# Patient Record
Sex: Female | Born: 1975 | Race: White | Hispanic: Yes | Marital: Married | Smoking: Never smoker
Health system: Southern US, Community
[De-identification: ages and names within clinical notes are randomized; demographics above are authoritative.]

---

## 2004-07-09 ENCOUNTER — Ambulatory Visit (HOSPITAL_COMMUNITY): Admission: RE | Admit: 2004-07-09 | Discharge: 2004-07-09 | Payer: Self-pay | Admitting: Obstetrics and Gynecology

## 2004-09-30 ENCOUNTER — Ambulatory Visit (HOSPITAL_COMMUNITY): Admission: RE | Admit: 2004-09-30 | Discharge: 2004-09-30 | Payer: Self-pay | Admitting: *Deleted

## 2004-10-24 ENCOUNTER — Inpatient Hospital Stay (HOSPITAL_COMMUNITY): Admission: AD | Admit: 2004-10-24 | Discharge: 2004-10-27 | Payer: Self-pay | Admitting: Obstetrics and Gynecology

## 2004-10-24 ENCOUNTER — Ambulatory Visit: Payer: Self-pay | Admitting: Obstetrics & Gynecology

## 2007-07-21 ENCOUNTER — Emergency Department (HOSPITAL_COMMUNITY): Admission: EM | Admit: 2007-07-21 | Discharge: 2007-07-21 | Payer: Self-pay | Admitting: Emergency Medicine

## 2009-07-26 ENCOUNTER — Emergency Department (HOSPITAL_COMMUNITY): Admission: EM | Admit: 2009-07-26 | Discharge: 2009-07-26 | Payer: Self-pay | Admitting: Emergency Medicine

## 2010-07-10 LAB — DIFFERENTIAL
Basophils Relative: 1 % (ref 0–1)
Eosinophils Absolute: 0.2 10*3/uL (ref 0.0–0.7)
Monocytes Absolute: 0.6 10*3/uL (ref 0.1–1.0)
Neutro Abs: 10 10*3/uL — ABNORMAL HIGH (ref 1.7–7.7)

## 2010-07-10 LAB — URINALYSIS, ROUTINE W REFLEX MICROSCOPIC
Bilirubin Urine: NEGATIVE
Glucose, UA: NEGATIVE mg/dL
Hgb urine dipstick: NEGATIVE
Ketones, ur: NEGATIVE mg/dL
Nitrite: NEGATIVE
Urobilinogen, UA: 0.2 mg/dL (ref 0.0–1.0)

## 2010-07-10 LAB — CBC
HCT: 39.8 % (ref 36.0–46.0)
Hemoglobin: 13.6 g/dL (ref 12.0–15.0)
MCHC: 34.3 g/dL (ref 30.0–36.0)
RDW: 12.8 % (ref 11.5–15.5)

## 2010-07-10 LAB — LIPASE, BLOOD: Lipase: 17 U/L (ref 11–59)

## 2010-07-10 LAB — COMPREHENSIVE METABOLIC PANEL
ALT: 28 U/L (ref 0–35)
AST: 19 U/L (ref 0–37)
Calcium: 9 mg/dL (ref 8.4–10.5)
GFR calc non Af Amer: >60 mL/min (ref >60)
Glucose, Bld: 115 mg/dL — ABNORMAL HIGH (ref 70–99)

## 2013-03-04 ENCOUNTER — Ambulatory Visit: Payer: Self-pay

## 2013-03-08 ENCOUNTER — Ambulatory Visit: Payer: Self-pay

## 2014-11-04 ENCOUNTER — Encounter (HOSPITAL_COMMUNITY): Payer: Self-pay | Admitting: Emergency Medicine

## 2014-11-04 ENCOUNTER — Emergency Department (HOSPITAL_COMMUNITY): Payer: Self-pay

## 2014-11-04 ENCOUNTER — Emergency Department (HOSPITAL_COMMUNITY)
Admission: EM | Admit: 2014-11-04 | Discharge: 2014-11-04 | Disposition: A | Discharge status: Home / Self Care | Payer: Self-pay | Attending: Emergency Medicine | Admitting: Emergency Medicine

## 2014-11-04 DIAGNOSIS — R109 Unspecified abdominal pain: Secondary | ICD-10-CM

## 2014-11-04 DIAGNOSIS — K2970 Gastritis, unspecified, without bleeding: Secondary | ICD-10-CM | POA: Insufficient documentation

## 2014-11-04 LAB — CBC
HCT: 37.5 % (ref 36.0–46.0)
HEMOGLOBIN: 13.2 g/dL (ref 12.0–15.0)
MCH: 32.8 pg (ref 26.0–34.0)
MCHC: 35.2 g/dL (ref 30.0–36.0)
MCV: 93.1 fL (ref 78.0–100.0)
Platelets: 297 10*3/uL (ref 150–400)
RBC: 4.03 MIL/uL (ref 3.87–5.11)
RDW: 12.6 % (ref 11.5–15.5)
WBC: 10 10*3/uL (ref 4.0–10.5)

## 2014-11-04 LAB — COMPREHENSIVE METABOLIC PANEL
ALT: 28 U/L (ref 14–54)
AST: 20 U/L (ref 15–41)
Albumin: 3.8 g/dL (ref 3.5–5.0)
Alkaline Phosphatase: 76 U/L (ref 38–126)
Anion gap: 8 (ref 5–15)
BUN: 8 mg/dL (ref 6–20)
CALCIUM: 9.3 mg/dL (ref 8.9–10.3)
CHLORIDE: 101 mmol/L (ref 101–111)
CO2: 26 mmol/L (ref 22–32)
Creatinine, Ser: 0.49 mg/dL (ref 0.44–1.00)
GFR calc Af Amer: >60 mL/min (ref >60)
Glucose, Bld: 120 mg/dL — ABNORMAL HIGH (ref 65–99)
POTASSIUM: 3.6 mmol/L (ref 3.5–5.1)
SODIUM: 135 mmol/L (ref 135–145)
Total Bilirubin: 0.3 mg/dL (ref 0.3–1.2)
Total Protein: 7.3 g/dL (ref 6.5–8.1)

## 2014-11-04 LAB — URINALYSIS, ROUTINE W REFLEX MICROSCOPIC
Bilirubin Urine: NEGATIVE
GLUCOSE, UA: NEGATIVE mg/dL
HGB URINE DIPSTICK: NEGATIVE
Ketones, ur: NEGATIVE mg/dL
Leukocytes, UA: NEGATIVE
Nitrite: NEGATIVE
PROTEIN: 30 mg/dL — AB
Specific Gravity, Urine: 1.009 (ref 1.005–1.030)
Urobilinogen, UA: 0.2 mg/dL (ref 0.0–1.0)
pH: 7.5 (ref 5.0–8.0)

## 2014-11-04 LAB — I-STAT BETA HCG BLOOD, ED (MC, WL, AP ONLY)

## 2014-11-04 LAB — URINE MICROSCOPIC-ADD ON

## 2014-11-04 LAB — LIPASE, BLOOD: LIPASE: 16 U/L — AB (ref 22–51)

## 2014-11-04 MED ORDER — MORPHINE SULFATE 4 MG/ML IJ SOLN
4.0000 mg | Freq: Once | INTRAMUSCULAR | Status: AC
  Administered 2014-11-04: 4 mg via INTRAVENOUS
  Filled 2014-11-04: qty 1

## 2014-11-04 MED ORDER — SODIUM CHLORIDE 0.9 % IV BOLUS (SEPSIS)
1000.0000 mL | Freq: Once | INTRAVENOUS | Status: AC
  Administered 2014-11-04: 1000 mL via INTRAVENOUS

## 2014-11-04 MED ORDER — PANTOPRAZOLE SODIUM 20 MG PO TBEC: 20.0000 mg | DELAYED_RELEASE_TABLET | Freq: Every day | ORAL | Status: DC

## 2014-11-04 MED ORDER — ONDANSETRON HCL 4 MG/2ML IJ SOLN
4.0000 mg | Freq: Once | INTRAMUSCULAR | Status: AC
  Administered 2014-11-04: 4 mg via INTRAVENOUS
  Filled 2014-11-04: qty 2

## 2014-11-04 MED ORDER — PANTOPRAZOLE SODIUM 40 MG IV SOLR
40.0000 mg | INTRAVENOUS | Status: AC
  Administered 2014-11-04: 40 mg via INTRAVENOUS
  Filled 2014-11-04: qty 40

## 2014-11-04 MED ORDER — SUCRALFATE 1 GM/10ML PO SUSP: 1.0000 g | Freq: Three times a day (TID) | ORAL | Status: DC

## 2014-11-04 NOTE — ED Notes (Signed)
Patient transported to Ultrasound 

## 2014-11-04 NOTE — ED Notes (Signed)
Pt. reports mid abdominal pain with emesis onset today , denies diarrhea/no fever or chills.

## 2014-11-04 NOTE — Discharge Instructions (Signed)
Tome Protonix y American Family InsuranceCarafate como se prescribe para tratar sus sntomas . Haga un seguimiento con un mdico de atencin primaria . Volver al servicio de urgencias si los sntomas empeoran a pesar del tratamiento   Take Protonix and Carafate as prescribed for your symptoms. Follow up with a primary care doctor. Return to the ED if symptoms worsen despite the treatment recommended.  Gastritis - Adultos  (Gastritis, Adult)  La gastrittis es la irritacin (inflamacin) de la membrana interna del estmago. Puede ser Neomia Dearuna enfermedad de inicio sbito (aguda) o de largo plazo (crnica). Si la gastritis no se trata, puede causar sangrado y lceras. CAUSAS  La gastritis se produce cuando la membrana que tapiza interiormente al estmago se debilita o se daa. Los jugos digestivos del estmago inflaman el revestimiento del estmago debilitado. El revestimiento del estmago puede debilitarse o daarse por una infeccin viral o bacteriana. La infeccin bacteriana ms comn es la infeccin por Helicobacter pylori. Tambin puede ser el resultado del consumo excesivo de alcohol, por el uso de ciertos medicamentos o porque hay demasiado cido en el estmago.  SNTOMAS  En algunos casos no hay sntomas. Si se presentan sntomas, stos pueden ser:   Dolor o sensacin de ardor en la parte superior del abdomen.  Nuseas.  Vmitos.  Sensacin molesta de distensin despus de comer. DIAGNSTICO  El mdico puede diagnosticar gastritis segn los sntomas y el examen fsico. Para determinar la causa de la gastritis, el mdico podr:   Pedir anlisis de sangre o de materia fecal para diagnosticar la presencia de la bacteria H pylori.  Gastroscopa. Un tubo delgado y flexible (endoscopio) se pasa por Theatre stage managerel esfago hasta llegar al Teachers Insurance and Annuity Associationestmago. El endoscopio tiene [place] luz y una cmara en el extremo. El mdico utilizar el endoscopio para observar el interior del [place]mago.  Tomar una muestra de tejido (biopsia) del estmago para  examinarlo en el microscopio. TRATAMIENTO  Segn la causa de la gastritis podrn recetarle: Antibiticos, si la causa es una infeccin bacteriana, como una infeccin por H. pylori. Anticidos o bloqueadores H2, si hay demasiado cido en el estmago. El Office Depotmdico le aconsejar que deje de tomar aspirina, ibuprofeno u otros antiinflamatorios no esteroides (AINE).  INSTRUCCIONES PARA EL CUIDADO EN EL HOGAR   Tome slo medicamentos de venta libre o recetados, segn las indicaciones del mdico.  Si le han recetado antibiticos, tmelos segn las indicaciones. Tmelos todos, aunque se sienta mejor.  Debe ingerir gran cantidad de lquido para mantener la orina de tono claro o color amarillo plido.  Evite las comidas y bebidas que [address] Avenueempeoran los Lorraineproblemas, [place]:  MinnesotaBebidas con cafena o alcohlicas.  Chocolate.  Sabores a Advertising account plannermenta.  Ajo y cebolla.  Comidas muy condimentadas.  Ctricos como naranjas, limones o limas.  Alimentos que contengan tomate, como salsas, [place] y pizza.  Alimentos fritos y Lexicographergrasos.  Haga comidas pequeas durante Glass blower/designerel da en lugar de 3 comidas abundantes. SOLICITE ATENCIN MDICA DE INMEDIATO SI:   La materia fecal es negra o de color rojo oscuro.  Vomita sangre de color rojo brillante o material similar a granos de caf.  No puede retener los lquidos.  El dolor abdominal empeora.  Tiene fiebre.  No mejora luego de 1 semana.  Tiene preguntas o preocupaciones. ASEGRESE DE QUE:   Comprende estas instrucciones.  Controlar su enfermedad.  Solicitar ayuda de inmediato si no mejora o si empeora. Document Released: 01/15/2005 Document Revised: 12/31/2011  [facility] Patient Information 2015 [place], [place]. This information is not intended to replace advice  given to you by your health care provider. Make sure you discuss any questions you have with your health care provider.

## 2014-11-04 NOTE — ED Notes (Signed)
Pt still at [facility]

## 2014-11-04 NOTE — ED Provider Notes (Signed)
CSN: [id number]     Arrival date & time 11/04/14  1912 History   First MD Initiated Contact with Patient 11/04/14 2022     Chief Complaint  Patient presents with  . Abdominal Pain  . Emesis    (Consider location/radiation/quality/duration/timing/severity/associated sxs/prior Treatment) HPI Comments: 39 year old female with no significant past medical history presents to the emergency department for further evaluation of abdominal pain. Patient reports that pain began at 1300 today, approximately one hour after eating. Patient reports that pain has been constant and present in her epigastric region without radiation. She denies taking any medications for symptoms prior to arrival. She has had associated nausea with 4 episodes of emesis since her pain began. Patient denies any history of similar symptoms. She has had no abdominal surgeries in the past. No reported fever, chest pain, shortness of breath, diarrhea, hematuria or dysuria. Pain is currently rated at 5/10.  Patient is a 39 y.o. female presenting with abdominal pain and vomiting. The history is provided by the patient. A language interpreter was used Chief Technology Officer(Language line used).  Abdominal Pain Associated symptoms: nausea and vomiting   Emesis Associated symptoms: abdominal pain     History reviewed. No pertinent past medical history. History reviewed. No pertinent past surgical history. No family history on file. History  Substance Use Topics  . Smoking status: Never Smoker   . Smokeless tobacco: Not on file  . Alcohol Use: No   OB History    No data available      Review of Systems  Gastrointestinal: Positive for nausea, vomiting and abdominal pain.  All other systems reviewed and are negative.   Allergies  Review of patient's allergies indicates no known allergies.  Home Medications   Prior to Admission medications   Medication Sig Start Date End Date Taking? Authorizing Provider  pantoprazole (PROTONIX) 20 MG tablet  Take 1 tablet (20 mg total) by mouth daily. 11/04/14   Antony MaduraKelly Zyeir Dymek, PA-C  sucralfate (CARAFATE) 1 GM/10ML suspension Take 10 mLs (1 g total) by mouth 4 (four) times daily -  with meals and at bedtime. 11/04/14   Antony MaduraKelly Lyn Joens, PA-C   BP 120/75 mmHg  Pulse 57  Temp(Src) 98.2 F (36.8 C) (Oral)  Resp 16  Ht 4\' 10"  (1.473 m)  Wt 185 lb (83.915 kg)  BMI 38.68 kg/m2  SpO2 98%  LMP 10/24/2014   Physical Exam  Constitutional: She is oriented to person, place, and time. She appears well-developed and well-nourished. No distress.  Nontoxic/nonseptic appearing  HENT:  Head: Normocephalic and atraumatic.  Eyes: Conjunctivae and EOM are normal. No scleral icterus.  Neck: Normal range of motion.  Cardiovascular: Normal rate, regular rhythm and intact distal pulses.   Pulmonary/Chest: Effort normal and breath sounds normal. No respiratory distress. She has no wheezes. She has no rales.  Respirations even and unlabored. Lungs CTAB.  Abdominal: Soft. She exhibits no distension. There is tenderness. There is no rebound and no guarding.  Tenderness to palpation in the epigastric abdomen. No masses or peritoneal signs appreciated. Abdomen is soft. Normoactive bowel sounds heard in all quadrants. Negative Murphy's sign.  Musculoskeletal: Normal range of motion.  Neurological: She is alert and oriented to person, place, and time. She exhibits normal muscle tone. Coordination normal.  GCS 15. Patient moving all extremities.  Skin: Skin is warm and dry. No rash noted. She is not diaphoretic. No erythema. No pallor.  Psychiatric: She has a normal mood and affect. Her behavior is normal.  Nursing note and  vitals reviewed.   ED Course  Procedures (including critical care time) Labs Review Labs Reviewed  LIPASE, BLOOD - Abnormal; Notable for the following:    Lipase 16 (*)    All other components within normal limits  COMPREHENSIVE METABOLIC PANEL - Abnormal; Notable for the following:    Glucose, Bld 120  (*)    All other components within normal limits  URINALYSIS, ROUTINE W REFLEX MICROSCOPIC (NOT AT [facility]) - Abnormal; Notable for the following:    Protein, ur 30 (*)    All other components within normal limits  URINE MICROSCOPIC-ADD ON - Abnormal; Notable for the following:    Squamous Epithelial / LPF FEW (*)    All other components within normal limits  CBC  I-STAT BETA HCG BLOOD, ED ([facility], [facility], [facility] ONLY)    Imaging Review US Abdomen Complete  11/04/2014   CLINICAL DATA:  Epigastric pain and vomiting for 1 day.  EXAM: ULTRASOUND ABDOMEN COMPLETE  COMPARISON:  None.  FINDINGS: Gallbladder: No gallstones or wall thickening visualized. No sonographic Murphy sign noted.  Common bile duct: Diameter: 4.4 mm  Liver: Diffusely echogenic without intrahepatic biliary dilatation or definite mass. Hepatopetal portal vein.  IVC: No abnormality visualized.  Pancreas: Visualized portion unremarkable.  Spleen: Size and appearance within normal limits.  Right Kidney: Length: 12.2 cm. Echogenicity within normal limits. No mass or hydronephrosis visualized.  Left Kidney: Length: 13.2 cm. Echogenicity within normal limits. No mass or hydronephrosis visualized.  Abdominal aorta: No aneurysm visualized.  Other findings: None.  IMPRESSION: Hepatic steatosis without sonographic findings of acute abdominal process.   Electronically Signed   By: Awilda Metro M.D.   On: 11/04/2014 22:26     EKG Interpretation None      MDM   Final diagnoses:  Abdominal pain  Gastritis    39 year old female presents to the emergency department for further evaluation of epigastric pain. Symptoms began one hour after eating and were associated with 4 episodes of emesis. Patient denies any chest pain or shortness of breath with her symptoms. Patient had complete relief of her pain with Protonix, morphine, Zofran, and fluids. Laboratory workup is noncontributory. Urinalysis and urine pregnancy negative. Abdominal ultrasound ordered  for further evaluation of symptoms, specifically to evaluate the gallbladder. Ultrasound shows evidence of hepatic steatosis without evidence of acute process.  Given reassuring workup today with stable abdominal reexamination, suspect symptoms may be secondary to gastritis. Patient does report a history of esophageal reflux. Doubt atypically presenting cardiac etiology. No indication for further emergent workup at this time. Will manage symptomatically as outpatient with Protonix and Carafate. Patient advised to follow-up with a primary care doctor regarding her symptoms today. Return precautions discussed and provided. Patient agreeable to plan with known addressed concerns. Patient discharged in good condition; VSS.   Filed Vitals:   11/04/14 2030 11/04/14 2115 11/04/14 2223 11/04/14 2300  BP: 136/83 126/81 135/75 120/75  Pulse: 71 64 63 57  Temp:      TempSrc:      Resp: Height:      Weight:      SpO2: 99% 99% 99% 98%     Antony Madura, PA-C 11/04/14 2348  Samuel Jester, DO 11/07/14 2209

## 2014-11-04 NOTE — ED Notes (Signed)
Pt. Left with all belongings and refused wheelchair 

## 2014-11-04 NOTE — ED Notes (Signed)
Pt returned from [facility]

## 2014-11-05 ENCOUNTER — Telehealth: Payer: Self-pay | Admitting: *Deleted

## 2014-11-05 NOTE — Telephone Encounter (Signed)
Pharmacist would like to substitute tabs for suspension as pt is able to afford one over the other. He will fill comparable amount.

## 2017-10-02 ENCOUNTER — Encounter (HOSPITAL_COMMUNITY): Payer: Self-pay

## 2017-10-02 ENCOUNTER — Emergency Department (HOSPITAL_COMMUNITY)
Admission: EM | Admit: 2017-10-02 | Discharge: 2017-10-02 | Disposition: A | Discharge status: Home / Self Care | Payer: Self-pay | Attending: Emergency Medicine | Admitting: Emergency Medicine

## 2017-10-02 ENCOUNTER — Other Ambulatory Visit: Payer: Self-pay

## 2017-10-02 DIAGNOSIS — Z79899 Other long term (current) drug therapy: Secondary | ICD-10-CM | POA: Insufficient documentation

## 2017-10-02 DIAGNOSIS — R1013 Epigastric pain: Secondary | ICD-10-CM | POA: Insufficient documentation

## 2017-10-02 LAB — URINALYSIS, ROUTINE W REFLEX MICROSCOPIC
Bilirubin Urine: NEGATIVE
GLUCOSE, UA: NEGATIVE mg/dL
Hgb urine dipstick: NEGATIVE
KETONES UR: NEGATIVE mg/dL
LEUKOCYTES UA: NEGATIVE
Nitrite: NEGATIVE
Protein, ur: NEGATIVE mg/dL
Specific Gravity, Urine: 1.006 (ref 1.005–1.030)
pH: 8 (ref 5.0–8.0)

## 2017-10-02 LAB — CBC
HCT: 39.8 % (ref 36.0–46.0)
Hemoglobin: 13.3 g/dL (ref 12.0–15.0)
MCH: 30.7 pg (ref 26.0–34.0)
MCHC: 33.4 g/dL (ref 30.0–36.0)
MCV: 91.9 fL (ref 78.0–100.0)
Platelets: 255 10*3/uL (ref 150–400)
RBC: 4.33 MIL/uL (ref 3.87–5.11)
RDW: 13 % (ref 11.5–15.5)
WBC: 7.1 10*3/uL (ref 4.0–10.5)

## 2017-10-02 LAB — COMPREHENSIVE METABOLIC PANEL
ALK PHOS: 91 U/L (ref 38–126)
ALT: 36 U/L (ref 14–54)
AST: 27 U/L (ref 15–41)
Albumin: 4.1 g/dL (ref 3.5–5.0)
Anion gap: 7 (ref 5–15)
BILIRUBIN TOTAL: 0.7 mg/dL (ref 0.3–1.2)
BUN: 9 mg/dL (ref 6–20)
CHLORIDE: 103 mmol/L (ref 101–111)
CO2: 27 mmol/L (ref 22–32)
CREATININE: 0.56 mg/dL (ref 0.44–1.00)
Calcium: 10.4 mg/dL — ABNORMAL HIGH (ref 8.9–10.3)
GFR calc Af Amer: >60 mL/min (ref >60)
Glucose, Bld: 127 mg/dL — ABNORMAL HIGH (ref 65–99)
Potassium: 3.4 mmol/L — ABNORMAL LOW (ref 3.5–5.1)
Sodium: 137 mmol/L (ref 135–145)
TOTAL PROTEIN: 7.9 g/dL (ref 6.5–8.1)

## 2017-10-02 LAB — I-STAT BETA HCG BLOOD, ED (MC, WL, AP ONLY): I-stat hCG, quantitative: <5 m[IU]/mL (ref <5)

## 2017-10-02 LAB — LIPASE, BLOOD: Lipase: 25 U/L (ref 11–51)

## 2017-10-02 MED ORDER — FAMOTIDINE 20 MG PO TABS
20.0000 mg | ORAL_TABLET | Freq: Once | ORAL | Status: AC
  Administered 2017-10-02: 20 mg via ORAL
  Filled 2017-10-02: qty 1

## 2017-10-02 MED ORDER — SUCRALFATE 1 GM/10ML PO SUSP: 1.0000 g | Freq: Three times a day (TID) | ORAL | 0 refills | Status: DC

## 2017-10-02 MED ORDER — GI COCKTAIL ~~LOC~~
30.0000 mL | Freq: Once | ORAL | Status: AC
  Administered 2017-10-02: 30 mL via ORAL
  Filled 2017-10-02: qty 30

## 2017-10-02 MED ORDER — OMEPRAZOLE 20 MG PO CPDR: 20.0000 mg | DELAYED_RELEASE_CAPSULE | Freq: Every day | ORAL | 1 refills | Status: DC

## 2017-10-02 NOTE — Discharge Instructions (Signed)
1.  Use the referral number in your discharge instructions to find a family doctor.  You should have a recheck within the next 1 to 2 weeks to see if your symptoms are improving.  If your symptoms are persisting or worsening, you may need other tests such as an upper endoscopy. 2.  Return to the emergency department if you develop vomiting, fever, worsening pain.

## 2017-10-02 NOTE — ED Triage Notes (Signed)
Patient complains of 4 days of epigastric pain that is worse with any intake. No nausea, no vomiting, denies dysuria

## 2017-10-02 NOTE — ED Provider Notes (Signed)
[facility] EMERGENCY DEPARTMENT Provider Note   CSN: [id number] Arrival date & time: 10/02/17  4098     History   Chief Complaint Chief Complaint  Patient presents with  . Abdominal Pain    HPI Yvette Stewart is a 42 y.o. female. Patient is seen with her daughter who is Nurse, learning disability and speaks [place] fluently. HPI Patient reports she is having problems with a feeling of fullness and bloating in her epigastrium.  She feels like food just sits there.  Patient has not had any vomiting but last night she did experience some nausea.  She cannot differentiate any specific foods that cause the symptoms.  This has been going on for about a week.  No lower abdominal pain, diarrhea or constipation.  No pain burning urgency with urination.  No fevers, chills or cough.  Patient has not tried any medication for symptoms.  She did have a similar episode several years ago.  She reports at that time they gave her some pills to take and a liquid to drink.  Symptoms improved and resolved. History reviewed. No pertinent past medical history.  There are no active problems to display for this patient.   History reviewed. No pertinent surgical history.   OB History   None      Home Medications    Prior to Admission medications   Medication Sig Start Date End Date Taking? Authorizing Provider  omeprazole (PRILOSEC) 20 MG capsule Take 1 capsule (20 mg total) by mouth daily. 10/02/17   Arby Barrette, MD  pantoprazole (PROTONIX) 20 MG tablet Take 1 tablet (20 mg total) by mouth daily. 11/04/14   Antony Madura, PA-C  sucralfate (CARAFATE) 1 GM/10ML suspension Take 10 mLs (1 g total) by mouth 4 (four) times daily -  with meals and at bedtime. 11/04/14   Antony Madura, PA-C  sucralfate (CARAFATE) 1 GM/10ML suspension Take 10 mLs (1 g total) by mouth 4 (four) times daily -  with meals and at bedtime. 10/02/17   Arby Barrette, MD    Family History No family history on  file.  Social History Social History   Tobacco Use  . Smoking status: Never Smoker  Substance Use Topics  . Alcohol use: No  . Drug use: No     Allergies   Patient has no known allergies.   Review of Systems Review of Systems 10 Systems reviewed and are negative for acute change except as noted in the HPI.   Physical Exam Updated Vital Signs BP (!) 158/81 (BP Location: Right Arm)   Pulse 76   Temp 98.2 F (36.8 C) (Oral)   Resp 18   SpO2 97%   Physical Exam  Constitutional: She is oriented to person, place, and time. She appears well-developed and well-nourished.  HENT:  Head: Normocephalic and atraumatic.  Eyes: EOM are normal.  Neck: Neck supple.  Cardiovascular: Normal rate, regular rhythm, normal heart sounds and intact distal pulses.  Pulmonary/Chest: Effort normal and breath sounds normal.  Abdominal: Soft. Bowel sounds are normal. She exhibits no distension. There is no tenderness. There is no guarding.  Musculoskeletal: Normal range of motion. She exhibits no edema.  Neurological: She is alert and oriented to person, place, and time. She has normal strength. She exhibits normal muscle tone. Coordination normal. GCS eye subscore is 4. GCS verbal subscore is 5. GCS motor subscore is 6.  Skin: Skin is warm, dry and intact.  Psychiatric: She has a normal mood and affect.  ED Treatments / Results  Labs (all labs ordered are listed, but only abnormal results are displayed) Labs Reviewed  COMPREHENSIVE METABOLIC PANEL - Abnormal; Notable for the following components:      Result Value   Potassium 3.4 (*)    Glucose, Bld 127 (*)    Calcium 10.4 (*)    All other components within normal limits  URINALYSIS, ROUTINE W REFLEX MICROSCOPIC - Abnormal; Notable for the following components:   Color, Urine STRAW (*)    All other components within normal limits  LIPASE, BLOOD  CBC  I-STAT BETA HCG BLOOD, ED ([facility], [facility], [facility] ONLY)    EKG None  Radiology No  results found.  Procedures Procedures (including critical care time)  Medications Ordered in ED Medications  famotidine (PEPCID) tablet 20 mg (has no administration in time range)  gi cocktail (Maalox,Lidocaine,Donnatal) (has no administration in time range)     Initial Impression / Assessment and Plan / ED Course  I have reviewed the triage vital signs and the nursing notes.  Pertinent labs & imaging results that were available during my care of the patient were reviewed by me and considered in my medical decision making (see chart for details).     Final Clinical Impressions(s) / ED Diagnoses   Final diagnoses:  Dyspepsia  Patient is clinically well in appearance.  Abdominal exam is nontender.  Symptoms are suggestive of gastritis or reflux.  Patient describes similar episode several years ago that resolved with medications.  This time, will have patient start Prilosec and Carafate.  She is counseled on small, low-fat meals.  Patient is counseled on needing recheck with a family provider to see if symptoms are resolved by treatment.  He is counseled on the potential need for upper endoscopy if symptoms persist or worsen.  Return precautions reviewed.  ED Discharge Orders        Ordered    omeprazole (PRILOSEC) 20 MG capsule  Daily     10/02/17 1020    sucralfate (CARAFATE) 1 GM/10ML suspension  3 times daily with meals & bedtime     10/02/17 1020       Arby BarrettePfeiffer, Novalee Horsfall, MD 10/02/17 1027

## 2017-10-05 ENCOUNTER — Other Ambulatory Visit: Payer: Self-pay

## 2017-10-05 ENCOUNTER — Encounter (HOSPITAL_COMMUNITY): Payer: Self-pay | Admitting: Emergency Medicine

## 2017-10-05 DIAGNOSIS — K2970 Gastritis, unspecified, without bleeding: Secondary | ICD-10-CM | POA: Insufficient documentation

## 2017-10-05 NOTE — ED Triage Notes (Signed)
Pt reports having burning in epigastric area and throat for the last week. Pt was seen on 10/02/17 for same and no improvement with medications.

## 2017-10-06 ENCOUNTER — Ambulatory Visit (INDEPENDENT_AMBULATORY_CARE_PROVIDER_SITE_OTHER): Payer: Self-pay | Admitting: Emergency Medicine

## 2017-10-06 ENCOUNTER — Other Ambulatory Visit: Payer: Self-pay

## 2017-10-06 ENCOUNTER — Encounter: Payer: Self-pay | Admitting: Emergency Medicine

## 2017-10-06 ENCOUNTER — Emergency Department (HOSPITAL_COMMUNITY)
Admission: EM | Admit: 2017-10-06 | Discharge: 2017-10-06 | Disposition: A | Discharge status: Home / Self Care | Payer: Self-pay | Attending: Emergency Medicine | Admitting: Emergency Medicine

## 2017-10-06 VITALS — BP 130/66 | HR 74 | Temp 98.6°F | Resp 16 | Ht 61.5 in | Wt 186.2 lb

## 2017-10-06 DIAGNOSIS — R109 Unspecified abdominal pain: Secondary | ICD-10-CM

## 2017-10-06 DIAGNOSIS — K2100 Gastro-esophageal reflux disease with esophagitis, without bleeding: Secondary | ICD-10-CM

## 2017-10-06 DIAGNOSIS — K2970 Gastritis, unspecified, without bleeding: Secondary | ICD-10-CM

## 2017-10-06 LAB — URINALYSIS, ROUTINE W REFLEX MICROSCOPIC
Bilirubin Urine: NEGATIVE
Glucose, UA: NEGATIVE mg/dL
Hgb urine dipstick: NEGATIVE
Ketones, ur: 5 mg/dL — AB
LEUKOCYTES UA: NEGATIVE
NITRITE: NEGATIVE
PROTEIN: NEGATIVE mg/dL
Specific Gravity, Urine: 1.004 — ABNORMAL LOW (ref 1.005–1.030)
pH: 7 (ref 5.0–8.0)

## 2017-10-06 LAB — POCT URINALYSIS DIP (MANUAL ENTRY)
BILIRUBIN UA: NEGATIVE
BILIRUBIN UA: NEGATIVE mg/dL
Glucose, UA: 100 mg/dL — AB
Leukocytes, UA: NEGATIVE
NITRITE UA: NEGATIVE
PH UA: 7 (ref 5.0–8.0)
PROTEIN UA: NEGATIVE mg/dL
RBC UA: NEGATIVE
Spec Grav, UA: 1.01 (ref 1.010–1.025)
Urobilinogen, UA: 0.2 E.U./dL

## 2017-10-06 LAB — COMPREHENSIVE METABOLIC PANEL
ALK PHOS: 86 U/L (ref 38–126)
ALT: 39 U/L (ref 14–54)
ANION GAP: 7 (ref 5–15)
AST: 26 U/L (ref 15–41)
Albumin: 4.6 g/dL (ref 3.5–5.0)
BUN: 10 mg/dL (ref 6–20)
CHLORIDE: 106 mmol/L (ref 101–111)
CO2: 26 mmol/L (ref 22–32)
Calcium: 10.1 mg/dL (ref 8.9–10.3)
Creatinine, Ser: 0.5 mg/dL (ref 0.44–1.00)
GFR calc non Af Amer: >60 mL/min (ref >60)
Glucose, Bld: 122 mg/dL — ABNORMAL HIGH (ref 65–99)
POTASSIUM: 3.6 mmol/L (ref 3.5–5.1)
SODIUM: 139 mmol/L (ref 135–145)
Total Bilirubin: 0.9 mg/dL (ref 0.3–1.2)
Total Protein: 8.1 g/dL (ref 6.5–8.1)

## 2017-10-06 LAB — CBC WITH DIFFERENTIAL/PLATELET
BASOS PCT: 0 %
Basophils Absolute: 0 10*3/uL (ref 0.0–0.1)
EOS ABS: 0.1 10*3/uL (ref 0.0–0.7)
EOS PCT: 1 %
HCT: 38.9 % (ref 36.0–46.0)
HEMOGLOBIN: 13.3 g/dL (ref 12.0–15.0)
Lymphocytes Relative: 26 %
Lymphs Abs: 2.3 10*3/uL (ref 0.7–4.0)
MCH: 31.3 pg (ref 26.0–34.0)
MCHC: 34.2 g/dL (ref 30.0–36.0)
MCV: 91.5 fL (ref 78.0–100.0)
MONOS PCT: 7 %
Monocytes Absolute: 0.6 10*3/uL (ref 0.1–1.0)
NEUTROS PCT: 66 %
Neutro Abs: 5.9 10*3/uL (ref 1.7–7.7)
PLATELETS: 262 10*3/uL (ref 150–400)
RBC: 4.25 MIL/uL (ref 3.87–5.11)
RDW: 13.3 % (ref 11.5–15.5)
WBC: 8.8 10*3/uL (ref 4.0–10.5)

## 2017-10-06 LAB — LIPASE, BLOOD: Lipase: 21 U/L (ref 11–51)

## 2017-10-06 LAB — POCT URINE PREGNANCY: Preg Test, Ur: NEGATIVE

## 2017-10-06 LAB — TROPONIN I

## 2017-10-06 LAB — PREGNANCY, URINE: PREG TEST UR: NEGATIVE

## 2017-10-06 MED ORDER — SODIUM CHLORIDE 0.9 % IV BOLUS
1000.0000 mL | Freq: Once | INTRAVENOUS | Status: AC
  Administered 2017-10-06: 1000 mL via INTRAVENOUS

## 2017-10-06 MED ORDER — ONDANSETRON HCL 4 MG/2ML IJ SOLN
4.0000 mg | Freq: Once | INTRAMUSCULAR | Status: AC
  Administered 2017-10-06: 4 mg via INTRAVENOUS
  Filled 2017-10-06: qty 2

## 2017-10-06 MED ORDER — FAMOTIDINE IN NACL 20-0.9 MG/50ML-% IV SOLN
20.0000 mg | Freq: Once | INTRAVENOUS | Status: AC
  Administered 2017-10-06: 20 mg via INTRAVENOUS
  Filled 2017-10-06: qty 50

## 2017-10-06 MED ORDER — GI COCKTAIL ~~LOC~~
30.0000 mL | Freq: Once | ORAL | Status: AC
  Administered 2017-10-06: 30 mL via ORAL
  Filled 2017-10-06: qty 30

## 2017-10-06 MED ORDER — RANITIDINE HCL 300 MG PO TABS: 300.0000 mg | ORAL_TABLET | Freq: Every day | ORAL | 1 refills | Status: DC

## 2017-10-06 NOTE — Discharge Instructions (Addendum)
Continue the Prilosec and Carafate.  Avoid alcohol, NSAID medications, spicy foods, caffeine.  Follow-up with the stomach doctor for an endoscopy.  Return to the ED if you develop new or worsening symptoms.

## 2017-10-06 NOTE — Progress Notes (Signed)
Yvette Stewart 42 y.o.   Chief Complaint  Patient presents with  . Abdominal Pain    x 1 week  . Nausea    HISTORY OF PRESENT ILLNESS: This is a 42 y.o. female complaining of upper abdominal pain with nausea on and off for several weeks.  Seen in the emergency room today.  Unremarkable blood work.  Normal abdominal ultrasound done 3 years ago for the same.  No history of gallstones.  Preliminary diagnosis was GERD with esophagitis.  She complains of burning pain in the epigastric area shooting up into her throat area given her at times a dry mouth with a sour taste.  Symptoms are worse when lying down and better when sitting up.  Has nausea but denies vomiting, melena, or rectal bleeding.  Today was started on omeprazole and Carafate.  Feels better now than earlier. Pertinent labs & imaging results that were available during my care of the patient were reviewed by me and considered in my medical decision making (see chart for details).  HPI   Prior to Admission medications   Medication Sig Start Date End Date Taking? Authorizing Provider  omeprazole (PRILOSEC) 20 MG capsule Take 1 capsule (20 mg total) by mouth daily. 10/02/17  Yes Arby Barrette, MD  sucralfate (CARAFATE) 1 GM/10ML suspension Take 10 mLs (1 g total) by mouth 4 (four) times daily -  with meals and at bedtime. 11/04/14  Yes Antony Madura, PA-C  pantoprazole (PROTONIX) 20 MG tablet Take 1 tablet (20 mg total) by mouth daily. Patient not taking: Reported on 10/06/2017 11/04/14   Antony Madura, PA-C  sucralfate (CARAFATE) 1 GM/10ML suspension Take 10 mLs (1 g total) by mouth 4 (four) times daily -  with meals and at bedtime. Patient not taking: Reported on 10/06/2017 10/02/17   Arby Barrette, MD  sucralfate (CARAFATE) 1 GM/10ML suspension Take 1 g by mouth 4 (four) times daily -  with meals and at bedtime.    [provider]    Allergies  Allergen Reactions  . Tylenol [Acetaminophen] Other (See Comments)    Puffy lips    There are no active problems to display for this patient.   No past medical history on file.  No past surgical history on file.  Social History   Socioeconomic History  . Marital status: Married    Spouse name: Not on file  . Number of children: Not on file  . Years of education: Not on file  . Highest education level: Not on file  Occupational History  . Not on file  Social Needs  . Financial resource strain: Not on file  . Food insecurity:    Worry: Not on file    Inability: Not on file  . Transportation needs:    Medical: Not on file    Non-medical: Not on file  Tobacco Use  . Smoking status: Never Smoker  . Smokeless tobacco: Never Used  Substance and Sexual Activity  . Alcohol use: No  . Drug use: No  . Sexual activity: Not on file  Lifestyle  . Physical activity:    Days per week: Not on file    Minutes per session: Not on file  . Stress: Not on file  Relationships  . Social connections:    Talks on phone: Not on file    Gets together: Not on file    Attends religious service: Not on file    Active member of club or organization: Not on file  Attends meetings of clubs or organizations: Not on file    Relationship status: Not on file  . Intimate partner violence:    Fear of current or ex partner: Not on file    Emotionally abused: Not on file    Physically abused: Not on file    Forced sexual activity: Not on file  Other Topics Concern  . Not on file  Social History Narrative  . Not on file    No family history on file.   Review of Systems  Constitutional: Negative.  Negative for chills, fever and weight loss.  HENT: Positive for sore throat. Negative for congestion and nosebleeds.   Eyes: Negative.   Respiratory: Negative.  Negative for cough and shortness of breath.   Cardiovascular: Negative.  Negative for chest pain and palpitations.  Gastrointestinal: Positive for abdominal pain, heartburn and nausea. Negative for blood in  stool, diarrhea, melena and vomiting.  Genitourinary: Negative.  Negative for dysuria and hematuria.  Skin: Negative.  Negative for rash.  Neurological: Negative.  Negative for dizziness and headaches.  Endo/Heme/Allergies: Negative.   All other systems reviewed and are negative.   Vitals:   10/06/17 1341  BP: 130/66  Pulse: 74  Resp: 16  Temp: 98.6 F (37 C)  SpO2: 100%    Physical Exam  Constitutional: She is oriented to person, place, and time. She appears well-developed and well-nourished.  HENT:  Head: Normocephalic and atraumatic.  Nose: Nose normal.  Mouth/Throat: Oropharynx is clear and moist.  Eyes: Pupils are equal, round, and reactive to light. Conjunctivae and EOM are normal.  Neck: Normal range of motion. Neck supple.  Cardiovascular: Normal rate, regular rhythm and normal heart sounds.  Pulmonary/Chest: Effort normal and breath sounds normal.  Abdominal: Soft. Bowel sounds are normal. She exhibits no distension and no mass. There is no tenderness. There is no guarding.  Musculoskeletal: Normal range of motion.  Lymphadenopathy:    She has no cervical adenopathy.  Neurological: She is alert and oriented to person, place, and time. No sensory deficit. She exhibits normal muscle tone.  Skin: Skin is warm and dry. Capillary refill takes less than 2 seconds.  Psychiatric: She has a normal mood and affect. Her behavior is normal.  Vitals reviewed.  Results for orders placed or performed in visit on 10/06/17 (from the past 24 hour(s))  POCT urinalysis dipstick     Status: Abnormal   Collection Time: 10/06/17  2:03 PM  Result Value Ref Range   Color, UA yellow yellow   Clarity, UA clear clear   Glucose, UA =100 (A) negative mg/dL   Bilirubin, UA negative negative   Ketones, POC UA negative negative mg/dL   Spec Grav, UA 9.629 5.284 - 1.025   Blood, UA negative negative   pH, UA 7.0 5.0 - 8.0   Protein Ur, POC negative negative mg/dL   Urobilinogen, UA 0.2 0.2  or 1.0 E.U./dL   Nitrite, UA Negative Negative   Leukocytes, UA Negative Negative  POCT urine pregnancy     Status: None   Collection Time: 10/06/17  2:06 PM  Result Value Ref Range   Preg Test, Ur Negative Negative     ASSESSMENT & PLAN: [name] was seen today for abdominal pain and nausea.  Diagnoses and all orders for this visit:  Abdominal pain, unspecified abdominal location -     POCT urinalysis dipstick -     POCT urine pregnancy  Gastroesophageal reflux disease with esophagitis -  ranitidine (ZANTAC) 300 MG tablet; Take 1 tablet (300 mg total) by mouth at bedtime for 14 days. -     Ambulatory referral to Gastroenterology    Patient Instructions    Continue omeprazole and Carafate.  Start Zantac at bedtime.   IF you received an x-ray today, you will receive an invoice from [facility]. Please contact [facility] at [phone] with questions or concerns regarding your invoice.   IF you received labwork today, you will receive an invoice from [place]. Please contact [facility] at [phone] with questions or concerns regarding your invoice.   Our billing staff will not be able to assist you with questions regarding bills from these companies.  You will be contacted with the lab results as soon as they are available. The fastest way to get your results is to activate your My Chart account. Instructions are located on the last page of this paperwork. If you have not heard from [place] regarding the results in 2 weeks, please contact this office.     Enfermedad por reflujo gastroesofgico en los adultos (Gastroesophageal Reflux Disease, Adult) Normalmente, los alimentos descienden por el esfago y se depositan en el estmago para su digestin. Si una persona tiene enfermedad por reflujo gastroesofgico (ERGE), los alimentos y el cido estomacal regresan al esfago. Cuando esto ocurre, el esfago se irrita y se hincha (inflama). Con el tiempo, la ERGE  puede provocar la formacin de pequeas perforaciones (lceras) en la mucosa del esfago. CUIDADOS EN EL HOGAR Dieta  Siga la dieta como se lo haya indicado el mdico. Tal vez deba evitar los siguientes alimentos y bebidas: ? Caf y t (con o sin cafena). ? Bebidas que contengan alcohol. ? Bebidas energizantes y deportivas. ? Gaseosas o refrescos. ? Chocolate y cacao. ? Menta y esencias de [address] ? Ajo y cebollas. ? Rbano picante. ? Alimentos muy condimentados y cidos, como pimientos, [place] en polvo, curry en polvo, vinagre, salsas picantes y Engineer, water. ? Frutas ctricas y sus jugos, como naranjas, limones y limas. ? Alimentos a base de tomates, como salsa roja, [place], salsa y pizza con salsa roja. ? Alimentos fritos y Lexicographer, como rosquillas, papas fritas y aderezos con alto contenido de Holiday representative. ? Carnes con alto contenido de [place], como hot dogs, filetes de entrecot, salchicha, jamn y tocino. ? Productos lcteos con alto contenido de grasa, como [place], [place] y [place] crema.  Consuma pequeas porciones de comida con ms frecuencia. Evite consumir porciones abundantes.  Evite beber mucho lquido con las comidas.  No coma durante las 2 o 3horas previas a la hora de [place].  No se acueste inmediatamente despus de comer.  No haga actividad fsica enseguida despus de comer. Instrucciones generales  Est atento a cualquier cambio en los sntomas.  Tome los medicamentos de venta libre y los recetados solamente como se lo haya indicado el mdico. No tome aspirina, ibuprofeno ni otros antiinflamatorios no esteroides (AINE), a menos que el mdico lo autorice.  No consuma ningn producto que contenga tabaco, lo que incluye cigarrillos, tabaco de Theatre manager y Administrator, Civil Service. Si necesita ayuda para dejar de fumar, consulte al mdico.  Use ropa suelta. No use nada ajustado alrededor Reynolds American.  Levante (eleve) unas 6pulgadas (15centmetros) la cabecera  de la cama.  Intente bajar el nivel de estrs. Si necesita ayuda para hacerlo, consulte al American Express.  Si tiene sobrepeso, Media planner un peso saludable. Pregntele a su mdico cmo puede perder peso de manera segura.  Concurra  a todas las visitas de control como se lo haya indicado el mdico. Esto es importante. SOLICITE AYUDA SI:  Aparecen nuevos sntomas.  Baja de [place] y no sabe por qu.  Tiene dificultad para tragar o siente dolor al Darden Restaurantshacerlo.  Tiene sibilancias o tos que no desaparece.  Los sntomas no mejoran con Scientist, research (medical)el tratamiento.  Tiene la voz ronca. SOLICITE AYUDA DE INMEDIATO SI:  Tiene dolor en los brazos, el cuello, los McDonoughmaxilares, la dentadura o la espalda.  Berenice Primasranspira, se marea o tiene sensacin de desvanecimiento.  Siente falta de aire o Journalist, newspaperdolor en el pecho.  Vomita y el vmito es parecido a la sangre o a los granos de caf.  Pierde el conocimiento (se desmaya).  Las heces son sanguinolentas o de color negro.  No puede tragar, beber o comer. Esta informacin no tiene Theme park managercomo fin reemplazar el consejo del mdico. Asegrese de hacerle al mdico cualquier pregunta que tenga. Document Released: 05/10/2010 Document Revised: 12/27/2014 Document Reviewed: 08/02/2014 Elsevier Interactive Patient Education  2018 [employer]vier [employer].  Opciones de alimentos para pacientes con reflujo gastroesofgico - Adultos (Food Choices for Gastroesophageal Reflux Disease, Adult) Cuando se tiene reflujo gastroesofgico (ERGE), los alimentos que se ingieren y los hbitos de alimentacin son Engineer, productionmuy importantes. Elegir los alimentos adecuados puede ayudar a Altria Groupaliviar las molestias. QU PAUTAS DEBO SEGUIR?  Elija las frutas, los vegetales, los cereales integrales y los productos lcteos con bajo contenido de Falknergrasa.  Elija las carnes de [place], de pescado y de ave con bajo contenido de grasas.  Limite las grasas, [address]como los Craigaceites, los aderezos para [place]alada, la [place], los frutos secos y Forensic scientistel  aguacate.  Lleve un registro de alimentos. Esto ayuda a identificar los alimentos que ocasionan sntomas.  Evite los alimentos que le ocasionen sntomas. Pueden ser distintos para cada persona.  Haga comidas pequeas durante Glass blower/designerel da en lugar de 3 comidas abundantes.  Coma lentamente, en un lugar donde est distendido.  Limite el consumo de alimentos fritos.  Cocine los alimentos utilizando mtodos que no sean la fritura.  Evite el consumo alcohol.  Evite beber grandes cantidades de lquidos con las comidas.  Evite agacharse o recostarse hasta despus de 2 o 3horas de haber comido.  QU ALIMENTOS NO SE RECOMIENDAN? Estos son algunos alimentos y bebidas que pueden empeorar los sntomas: Veterinary surgeonVegetales Tomates. Jugo de tomate. Salsa de tomate y espagueti. Ajes. [place] y [place] Heightsajo. Rbano picante. Frutas Naranjas, pomelos y limn (fruta y Sloveniajugo). Carnes Carnes de [place] Grovevaca, de pescado y de ave con gran contenido de grasas. Esto incluye los perros calientes, las Uplandcostillas, el Skiatookjamn, la salchicha, el salame y el tocino. Lcteos Leche entera y [place]leche chocolatada. PPG IndustriesCrema cida. Crema. Mantequilla. Helados. Queso crema. Bebidas T o caf. Bebidas gaseosas o bebidas energizantes. Condimentos Salsa picante. Salsa barbacoa. Dulces/postres Chocolate y cacao. Rosquillas. Menta y mentol. Grasas y Du Pontaceites Alimentos muy grasos. Esto incluye las papas fritas. Otros Vinagre. Especias picantes. Esto incluye la pimienta negra, la pimienta blanca, la pimienta roja, la pimienta de cayena, el curry en polvo, los clavos de Campo Verdeolor, el jengibre y el Arub[place]ile en polvo. Esta no es Raytheonuna lista completa de los alimentos y las bebidas que se Theatre stage managerdeben evitar. Comunquese con el nutricionista para recibir ms informacin. Esta informacin no tiene Theme park managercomo fin reemplazar el consejo del mdico. Asegrese de hacerle al mdico cualquier pregunta que tenga. Document Released: 10/07/2011 Document Revised: 04/28/2014 Document Reviewed:  02/09/2013 Elsevier Interactive Patient Education  2017 [employer]vier Inc.      HendersonMiguel Satvik Parco,  MD [facility]

## 2017-10-06 NOTE — Patient Instructions (Addendum)
Continue omeprazole and Carafate.  Start Zantac at bedtime.   IF you received an x-ray today, you will receive an invoice from [facility]. Please contact [facility] Radiology at [phone] with questions or concerns regarding your invoice.   IF you received labwork today, you will receive an invoice from [place]. Please contact [facility] at [phone] with questions or concerns regarding your invoice.   Our billing staff will not be able to assist you with questions regarding bills from these companies.  You will be contacted with the lab results as soon as they are available. The fastest way to get your results is to activate your My Chart account. Instructions are located on the last page of this paperwork. If you have not heard from [place] regarding the results in 2 weeks, please contact this office.     Enfermedad por reflujo gastroesofgico en los adultos (Gastroesophageal Reflux Disease, Adult) Normalmente, los alimentos descienden por el esfago y se depositan en el estmago para su digestin. Si una persona tiene enfermedad por reflujo gastroesofgico (ERGE), los alimentos y el cido estomacal regresan al esfago. Cuando esto ocurre, el esfago se irrita y se hincha (inflama). Con el tiempo, la ERGE puede provocar la formacin de pequeas perforaciones (lceras) en la mucosa del esfago. CUIDADOS EN EL HOGAR Dieta  Siga la dieta como se lo haya indicado el mdico. Tal vez deba evitar los siguientes alimentos y bebidas: ? Caf y t (con o sin cafena). ? Bebidas que contengan alcohol. ? Bebidas energizantes y deportivas. ? Gaseosas o refrescos. ? Chocolate y cacao. ? Menta y esencias de [address] ? Ajo y cebollas. ? Rbano picante. ? Alimentos muy condimentados y cidos, como pimientos, [place] en polvo, curry en polvo, vinagre, salsas picantes y Engineer, water. ? Frutas ctricas y sus jugos, como naranjas, limones y limas. ? Alimentos a base de tomates, como salsa roja,  [place], salsa y pizza con salsa roja. ? Alimentos fritos y Lexicographer, como rosquillas, papas fritas y aderezos con alto contenido de Holiday representative. ? Carnes con alto contenido de [place], como hot dogs, filetes de entrecot, salchicha, jamn y tocino. ? Productos lcteos con alto contenido de grasa, como [place], [place] y [place] crema.  Consuma pequeas porciones de comida con ms frecuencia. Evite consumir porciones abundantes.  Evite beber mucho lquido con las comidas.  No coma durante las 2 o 3horas previas a la hora de [place].  No se acueste inmediatamente despus de comer.  No haga actividad fsica enseguida despus de comer. Instrucciones generales  Est atento a cualquier cambio en los sntomas.  Tome los medicamentos de venta libre y los recetados solamente como se lo haya indicado el mdico. No tome aspirina, ibuprofeno ni otros antiinflamatorios no esteroides (AINE), a menos que el mdico lo autorice.  No consuma ningn producto que contenga tabaco, lo que incluye cigarrillos, tabaco de Theatre manager y Administrator, Civil Service. Si necesita ayuda para dejar de fumar, consulte al mdico.  Use ropa suelta. No use nada ajustado alrededor Reynolds American.  Levante (eleve) unas 6pulgadas (15centmetros) la cabecera de la cama.  Intente bajar el nivel de estrs. Si necesita ayuda para hacerlo, consulte al American Express.  Si tiene sobrepeso, Media planner un peso saludable. Pregntele a su mdico cmo puede perder peso de manera segura.  Concurra a todas las visitas de control como se lo haya indicado el mdico. Esto es importante. SOLICITE AYUDA SI:  Aparecen nuevos sntomas.  Baja de [place] y no sabe por qu.  Tiene dificultad para  tragar o siente dolor al hacerlo.  Tiene sibilancias o tos que no desaparece.  Los sntomas no mejoran con Scientist, research (medical)el tratamiento.  Tiene la voz ronca. SOLICITE AYUDA DE INMEDIATO SI:  Tiene dolor en los brazos, el cuello, los Thorsbymaxilares, la dentadura o  la espalda.  Berenice Primasranspira, se marea o tiene sensacin de desvanecimiento.  Siente falta de aire o Journalist, newspaperdolor en el pecho.  Vomita y el vmito es parecido a la sangre o a los granos de caf.  Pierde el conocimiento (se desmaya).  Las heces son sanguinolentas o de color negro.  No puede tragar, beber o comer. Esta informacin no tiene Theme park managercomo fin reemplazar el consejo del mdico. Asegrese de hacerle al mdico cualquier pregunta que tenga. Document Released: 05/10/2010 Document Revised: 12/27/2014 Document Reviewed: 08/02/2014 Elsevier Interactive Patient Education  2018 [employer]vin[employer]Elsevier Inc.  Opciones de alimentos para pacientes con reflujo gastroesofgico - Adultos (Food Choices for Gastroesophageal Reflux Disease, Adult) Cuando se tiene reflujo gastroesofgico (ERGE), los alimentos que se ingieren y los hbitos de alimentacin son Engineer, productionmuy importantes. Elegir los alimentos adecuados puede ayudar a Altria Groupaliviar las molestias. QU PAUTAS DEBO SEGUIR?  Elija las frutas, los vegetales, los cereales integrales y los productos lcteos con bajo contenido de Winfieldgrasa.  Elija las carnes de [place], de pescado y de ave con bajo contenido de grasas.  Limite las grasas, [address]como los Harriettaaceites, los aderezos para Unadillaensalada, la [place]manteca, los frutos secos y Programme researcher, broadcasting/film/videoel aguacate.  Lleve un registro de alimentos. Esto ayuda a identificar los alimentos que ocasionan sntomas.  Evite los alimentos que le ocasionen sntomas. Pueden ser distintos para cada persona.  Haga comidas pequeas durante Glass blower/designerel da en lugar de 3 comidas abundantes.  Coma lentamente, en un lugar donde est distendido.  Limite el consumo de alimentos fritos.  Cocine los alimentos utilizando mtodos que no sean la fritura.  Evite el consumo alcohol.  Evite beber grandes cantidades de lquidos con las comidas.  Evite agacharse o recostarse hasta despus de 2 o 3horas de haber comido.  QU ALIMENTOS NO SE RECOMIENDAN? Estos son algunos alimentos y bebidas que pueden  empeorar los sntomas: Veterinary surgeonVegetales Tomates. Jugo de tomate. Salsa de tomate y espagueti. Ajes. [place]eb[place] y [place]ajo. Rbano picante. Frutas Naranjas, pomelos y limn (fruta y Sloveniajugo). Carnes Carnes de Naz[place] Collegevaca, de pescado y de ave con gran contenido de grasas. Esto incluye los perros calientes, las Charmwoodcostillas, el Edgecliff Villagejamn, la salchicha, el salame y el tocino. Lcteos Leche entera y [place]ai[place]leleche chocolatada. PPG IndustriesCrema cida. Crema. Mantequilla. Helados. Queso crema. Bebidas T o caf. Bebidas gaseosas o bebidas energizantes. Condimentos Salsa picante. Salsa barbacoa. Dulces/postres Chocolate y cacao. Rosquillas. Menta y mentol. Grasas y Du Pontaceites Alimentos muy grasos. Esto incluye las papas fritas. Otros Vinagre. Especias picantes. Esto incluye la pimienta negra, la pimienta blanca, la pimienta roja, la pimienta de cayena, el curry en polvo, los clavos de Andersonolor, el jengibre y el [place]ile en polvo. Esta no es Raytheonuna lista completa de los alimentos y las bebidas que se Theatre stage managerdeben evitar. Comunquese con el nutricionista para recibir ms informacin. Esta informacin no tiene Theme park managercomo fin reemplazar el consejo del mdico. Asegrese de hacerle al mdico cualquier pregunta que tenga. Document Released: 10/07/2011 Document Revised: 04/28/2014 Document Reviewed: 02/09/2013 Elsevier Interactive Patient Education  2017 ArvinMeritorElsevier Inc.

## 2017-10-06 NOTE — ED Notes (Signed)
Discharge instructions reviewed with pt. Pt verbalized understanding. PIV removed. Pt ambulatory to waiting room.  

## 2017-10-06 NOTE — ED Provider Notes (Signed)
[facility]-EMERGENCY DEPT Provider Note   CSN: [id number] Arrival date & time: 10/05/17  2238     History   Chief Complaint Chief Complaint  Patient presents with  . Gastroesophageal Reflux    HPI Yvette Stewart is a 42 y.o. female.  Level 5 caveat for language barrier.  Family at bedside translating.  Patient presents with sensation of dry mouth and dry throat as well as fullness in her abdomen with nausea.  She was seen for similar symptoms in June 14 and given Prilosec and Carafate for suspected reflux disease.  She states these are not helping.  She cannot pinpoint a specific food that makes the symptoms better or worse.  She states the symptoms are somewhat better when she sits up and worse when she lies down.  No vomiting.  No fever, chills, cough, chest pain or shortness of breath.  No diarrhea or constipation.  This is patient's third ED visit in the past 3 years for similar symptoms.  She did have an ultrasound several years ago that showed no gallstones.  She denies any excessive alcohol or NSAID use.  She does not have a PCP.  Symptoms have been ongoing for about 10 days at this point.    The history is provided by the patient. The history is limited by a language barrier. A language interpreter was used.  Gastroesophageal Reflux  Associated symptoms include abdominal pain. Pertinent negatives include no chest pain, no headaches and no shortness of breath.    History reviewed. No pertinent past medical history.  There are no active problems to display for this patient.   History reviewed. No pertinent surgical history.   OB History   None      Home Medications    Prior to Admission medications   Medication Sig Start Date End Date Taking? Authorizing Provider  omeprazole (PRILOSEC) 20 MG capsule Take 1 capsule (20 mg total) by mouth daily. 10/02/17   Arby Barrette, MD  pantoprazole (PROTONIX) 20 MG tablet Take 1 tablet (20 mg  total) by mouth daily. 11/04/14   Antony Madura, PA-C  sucralfate (CARAFATE) 1 GM/10ML suspension Take 10 mLs (1 g total) by mouth 4 (four) times daily -  with meals and at bedtime. 11/04/14   Antony Madura, PA-C  sucralfate (CARAFATE) 1 GM/10ML suspension Take 10 mLs (1 g total) by mouth 4 (four) times daily -  with meals and at bedtime. 10/02/17   Arby Barrette, MD    Family History History reviewed. No pertinent family history.  Social History Social History   Tobacco Use  . Smoking status: Never Smoker  . Smokeless tobacco: Never Used  Substance Use Topics  . Alcohol use: No  . Drug use: No     Allergies   Patient has no known allergies.   Review of Systems Review of Systems  Constitutional: Positive for appetite change. Negative for activity change, fatigue and fever.  HENT: Negative for congestion, mouth sores and rhinorrhea.   Respiratory: Negative for cough, chest tightness and shortness of breath.   Cardiovascular: Negative for chest pain.  Gastrointestinal: Positive for abdominal pain and nausea. Negative for vomiting.  Genitourinary: Negative for dysuria, hematuria, vaginal bleeding and vaginal discharge.  Musculoskeletal: Negative for arthralgias and myalgias.  Neurological: Negative for dizziness, tremors, weakness and headaches.   all other systems are negative except as noted in the HPI and PMH.     Physical Exam Updated Vital Signs BP (!) 139/104   Pulse 75  Temp 98.2 F (36.8 C) (Oral)   Resp 17   Ht 5\' 2"  (1.575 m)   Wt 83.9 kg (185 lb)   SpO2 97%   BMI 33.84 kg/m   Physical Exam  Constitutional: She is oriented to person, place, and time. She appears well-developed and well-nourished. No distress.  HENT:  Head: Normocephalic and atraumatic.  Mouth/Throat: Oropharynx is clear and moist. No oropharyngeal exudate.  Eyes: Pupils are equal, round, and reactive to light. Conjunctivae and EOM are normal.  Neck: Normal range of motion. Neck supple.    No meningismus.  Cardiovascular: Normal rate, regular rhythm, normal heart sounds and intact distal pulses.  No murmur heard. Pulmonary/Chest: Effort normal and breath sounds normal. No respiratory distress.  Abdominal: Soft. There is tenderness. There is no rebound and no guarding.  Mild epigastric tenderness. No RUQ tenderness  Musculoskeletal: Normal range of motion. She exhibits no edema or tenderness.  Neurological: She is alert and oriented to person, place, and time. No cranial nerve deficit. She exhibits normal muscle tone. Coordination normal.  No ataxia on finger to nose bilaterally. No pronator drift. 5/5 strength throughout. CN 2-12 intact.Equal grip strength. Sensation intact.   Skin: Skin is warm.  Psychiatric: She has a normal mood and affect. Her behavior is normal.  Nursing note and vitals reviewed.    ED Treatments / Results  Labs (all labs ordered are listed, but only abnormal results are displayed) Labs Reviewed  COMPREHENSIVE METABOLIC PANEL - Abnormal; Notable for the following components:      Result Value   Glucose, Bld 122 (*)    All other components within normal limits  URINALYSIS, ROUTINE W REFLEX MICROSCOPIC - Abnormal; Notable for the following components:   Color, Urine STRAW (*)    Specific Gravity, Urine 1.004 (*)    Ketones, ur 5 (*)    All other components within normal limits  CBC WITH DIFFERENTIAL/PLATELET  LIPASE, BLOOD  TROPONIN I  PREGNANCY, URINE    EKG EKG Interpretation  Date/Time:  Monday October 05 2017 22:46:39 EDT Ventricular Rate:  77 PR Interval:    QRS Duration: 87 QT Interval:  379 QTC Calculation: 429 R Axis:   64 Text Interpretation:  Sinus rhythm Baseline wander in lead(s) V3 V5 Artifact No previous ECGs available Confirmed by Glynn Octaveancour, Kalya Troeger [phone]([id number]) on 10/06/2017 3:08:40 AM   Radiology No results found.  Procedures Procedures (including critical care time)  Medications Ordered in ED Medications  gi cocktail  (Maalox,Lidocaine,Donnatal) (has no administration in time range)  famotidine (PEPCID) IVPB 20 mg premix (has no administration in time range)  sodium chloride 0.9 % bolus 1,000 mL (has no administration in time range)  ondansetron (ZOFRAN) injection 4 mg (has no administration in time range)     Initial Impression / Assessment and Plan / ED Course  I have reviewed the triage vital signs and the nursing notes.  Pertinent labs & imaging results that were available during my care of the patient were reviewed by me and considered in my medical decision making (see chart for details).    Patient returns with epigastric fullness, bloating, dry mouth and dry throat.  Seen 5 days ago for acid reflux type symptoms.  States compliance with Prilosec and Carafate.  Patient's abdomen is soft without any peritoneal signs.  Her labs are reassuring with normal LFTs and lipase.  No right upper quadrant pain.  Low suspicion for gallbladder pathology.  Gallbladder ultrasound 3 years ago was normal.  Patient improved after  Pepcid and GI cocktail and IV fluids.  She is tolerating p.o.  Advised follow-up with GI.  Continue Prilosec and Carafate.  Avoid alcohol, NSAIDs, caffeine, spicy foods.  Return precautions discussed.  Final Clinical Impressions(s) / ED Diagnoses   Final diagnoses:  Gastritis without bleeding, unspecified chronicity, unspecified gastritis type    ED Discharge Orders    None       Juanmanuel Marohl, Jeannett Senior, MD 10/06/17 [phone]

## 2017-10-09 ENCOUNTER — Emergency Department (HOSPITAL_COMMUNITY)
Admission: EM | Admit: 2017-10-09 | Discharge: 2017-10-09 | Disposition: A | Discharge status: Home / Self Care | Payer: Self-pay | Attending: Emergency Medicine | Admitting: Emergency Medicine

## 2017-10-09 ENCOUNTER — Encounter (HOSPITAL_COMMUNITY): Payer: Self-pay | Admitting: *Deleted

## 2017-10-09 DIAGNOSIS — H9202 Otalgia, left ear: Secondary | ICD-10-CM

## 2017-10-09 DIAGNOSIS — Z79899 Other long term (current) drug therapy: Secondary | ICD-10-CM | POA: Insufficient documentation

## 2017-10-09 DIAGNOSIS — K047 Periapical abscess without sinus: Secondary | ICD-10-CM | POA: Insufficient documentation

## 2017-10-09 MED ORDER — NAPROXEN 500 MG PO TABS: 500.0000 mg | ORAL_TABLET | Freq: Two times a day (BID) | ORAL | 0 refills | Status: DC

## 2017-10-09 MED ORDER — AMOXICILLIN 500 MG PO CAPS: 500.0000 mg | ORAL_CAPSULE | Freq: Three times a day (TID) | ORAL | 0 refills | Status: DC

## 2017-10-09 MED ORDER — AMOXICILLIN 500 MG PO CAPS
500.0000 mg | ORAL_CAPSULE | Freq: Once | ORAL | Status: AC
  Administered 2017-10-09: 500 mg via ORAL
  Filled 2017-10-09: qty 1

## 2017-10-09 MED ORDER — TRAMADOL HCL 50 MG PO TABS
50.0000 mg | ORAL_TABLET | Freq: Once | ORAL | Status: AC
  Administered 2017-10-09: 50 mg via ORAL
  Filled 2017-10-09: qty 1

## 2017-10-09 NOTE — ED Provider Notes (Signed)
[facility]-EMERGENCY DEPT Provider Note   CSN: [id number] Arrival date & time: 10/09/17  1334     History   Chief Complaint Chief Complaint  Patient presents with  . Otalgia    HPI Yvette Stewart is a 42 y.o. female who presents to the ED with left ear pain. The pain started 2 days ago. The pain goes from the left ear to the left jaw and to the left side of the head. Patient has not taken anything for pain.  HPI  History reviewed. No pertinent past medical history.  Patient Active Problem List   Diagnosis Date Noted  . Abdominal pain 10/06/2017  . Gastroesophageal reflux disease with esophagitis 10/06/2017    History reviewed. No pertinent surgical history.   OB History   None      Home Medications    Prior to Admission medications   Medication Sig Start Date End Date Taking? Authorizing Provider  amoxicillin (AMOXIL) 500 MG capsule Take 1 capsule (500 mg total) by mouth 3 (three) times daily. 10/09/17   Janne Napoleon, NP  naproxen (NAPROSYN) 500 MG tablet Take 1 tablet (500 mg total) by mouth 2 (two) times daily. 10/09/17   Janne Napoleon, NP  omeprazole (PRILOSEC) 20 MG capsule Take 1 capsule (20 mg total) by mouth daily. 10/02/17   Arby Barrette, MD  pantoprazole (PROTONIX) 20 MG tablet Take 1 tablet (20 mg total) by mouth daily. Patient not taking: Reported on 10/06/2017 11/04/14   Antony Madura, PA-C  ranitidine (ZANTAC) 300 MG tablet Take 1 tablet (300 mg total) by mouth at bedtime for 14 days. 10/06/17 10/20/17  Georgina Quint, MD  sucralfate (CARAFATE) 1 GM/10ML suspension Take 10 mLs (1 g total) by mouth 4 (four) times daily -  with meals and at bedtime. 11/04/14   Antony Madura, PA-C  sucralfate (CARAFATE) 1 GM/10ML suspension Take 10 mLs (1 g total) by mouth 4 (four) times daily -  with meals and at bedtime. Patient not taking: Reported on 10/06/2017 10/02/17   Arby Barrette, MD  sucralfate (CARAFATE) 1 GM/10ML suspension Take  1 g by mouth 4 (four) times daily -  with meals and at bedtime.    [provider]    Family History No family history on file.  Social History Social History   Tobacco Use  . Smoking status: Never Smoker  . Smokeless tobacco: Never Used  Substance Use Topics  . Alcohol use: No  . Drug use: No     Allergies   Tylenol [acetaminophen]   Review of Systems Review of Systems  Constitutional: Negative for chills and fever.  HENT: Positive for dental problem and ear pain. Negative for trouble swallowing.   Respiratory: Negative for cough and shortness of breath.   Cardiovascular: Negative for chest pain.  Gastrointestinal: Negative for abdominal pain, nausea and vomiting.  Skin: Negative for rash.  Neurological: Positive for headaches.  Psychiatric/Behavioral: Negative for confusion.     Physical Exam Updated Vital Signs BP (!) 141/83 (BP Location: Right Arm)   Pulse 70   Temp 98.2 F (36.8 C) (Oral)   Resp 18   Ht 5\' 2"  (1.575 m)   Wt 84.8 kg (187 lb)   LMP 09/23/2017 (Exact Date)   SpO2 100%   BMI 34.20 kg/m   Physical Exam  Constitutional: She appears well-developed and well-nourished. No distress.  HENT:  Head: Normocephalic.  Right Ear: Tympanic membrane normal. No mastoid tenderness.  Left Ear: Tympanic membrane normal.  No mastoid tenderness.  Nose: Nose normal.  Mouth/Throat: Uvula is midline and oropharynx is clear and moist. Dental caries present.    First and second lower left molars with decay to the gumline and swelling and erythema surrounding the the teeth. Tender on exam.  Eyes: Pupils are equal, round, and reactive to light. Conjunctivae and EOM are normal.  Neck: Normal range of motion. Neck supple.  Cardiovascular: Normal rate.  Pulmonary/Chest: Effort normal.  Abdominal: Soft. There is no tenderness.  Musculoskeletal: Normal range of motion.  Lymphadenopathy:    She has cervical adenopathy (left).  Neurological: She is alert.    Skin: Skin is warm and dry.  Psychiatric: She has a normal mood and affect.  Nursing note and vitals reviewed.    ED Treatments / Results  Labs (all labs ordered are listed, but only abnormal results are displayed) Labs Reviewed - No data to display  EKG None  Radiology No results found.  Procedures Procedures (including critical care time)  Medications Ordered in ED Medications  amoxicillin (AMOXIL) capsule 500 mg (has no administration in time range)  traMADol (ULTRAM) tablet 50 mg (has no administration in time range)     Initial Impression / Assessment and Plan / ED Course  I have reviewed the triage vital signs and the nursing notes. Patient with left ear and left lower dental pain.  No gross abscess.  Exam unconcerning for Ludwig's angina or spread of infection.  Will treat with penicillin and anti-inflammatories medicine.  Urged patient to follow-up with dentist.    Final Clinical Impressions(s) / ED Diagnoses   Final diagnoses:  Dental infection  Left ear pain    ED Discharge Orders        Ordered    amoxicillin (AMOXIL) 500 MG capsule  3 times daily     10/09/17 1508    naproxen (NAPROSYN) 500 MG tablet  2 times daily     10/09/17 1508       Kerrie Buffaloeese, Shyam Dawson SlaughtersM, [place]NP 10/09/17 1514    Raeford RazorKohut, Stephen, MD 10/12/17 1621

## 2017-10-09 NOTE — ED Triage Notes (Signed)
Pt c/o left sided earache that radiates to head and neck. Denies fever

## 2017-10-18 ENCOUNTER — Emergency Department (HOSPITAL_COMMUNITY)
Admission: EM | Admit: 2017-10-18 | Discharge: 2017-10-18 | Disposition: A | Discharge status: Home / Self Care | Payer: Self-pay | Attending: Emergency Medicine | Admitting: Emergency Medicine

## 2017-10-18 ENCOUNTER — Emergency Department (HOSPITAL_COMMUNITY): Payer: Self-pay

## 2017-10-18 ENCOUNTER — Encounter (HOSPITAL_COMMUNITY): Payer: Self-pay | Admitting: Emergency Medicine

## 2017-10-18 DIAGNOSIS — K219 Gastro-esophageal reflux disease without esophagitis: Secondary | ICD-10-CM | POA: Insufficient documentation

## 2017-10-18 MED ORDER — LORATADINE 10 MG PO TABS: 10.0000 mg | ORAL_TABLET | Freq: Every day | ORAL | 0 refills | Status: DC

## 2017-10-18 MED ORDER — GI COCKTAIL ~~LOC~~
30.0000 mL | Freq: Once | ORAL | Status: AC
  Administered 2017-10-18: 30 mL via ORAL
  Filled 2017-10-18: qty 30

## 2017-10-18 NOTE — ED Triage Notes (Signed)
Patient here from home does not speak [place]. Complains of dry mouth since last night. Thinks it may be an allergic reaction to something she ate. "It feels like my throat is swollen or something is stuck".

## 2017-10-18 NOTE — Discharge Instructions (Addendum)
Try Biotene gentle formula, Sensodyne Pronamel, or ColgateSensitive ([facility] or any drug store without a prescription) to help with tongue pain.

## 2017-10-18 NOTE — ED Provider Notes (Signed)
[facility]-EMERGENCY DEPT Provider Note   CSN: [id number] Arrival date & time: 10/18/17  0753     History   Chief Complaint Chief Complaint  Patient presents with  . Oral Pain  . Allergic Reaction    HPI Yvette Stewart is a 42 y.o. female.  Pt presents to the ED today with dry mouth and feeling like something is stuck in her throat.  Pt said sx appear after smelling strong perfume or other strong odors.  She feels like her throat swells. She has a bad taste in her mouth and said her tongue turns white.  She has a hx of GERD and took her usual meds, but they did not help.  She is able to swallow and is able to speak.  Pt speaks Spanish.  She was offered a video interpreter, but wanted her daughter to interpret.     History reviewed. No pertinent past medical history.  There are no active problems to display for this patient.   History reviewed. No pertinent surgical history.   OB History   None      Home Medications    Prior to Admission medications   Medication Sig Start Date End Date Taking? Authorizing Provider  loratadine (CLARITIN) 10 MG tablet Take 1 tablet (10 mg total) by mouth daily. 10/18/17   Jacalyn Lefevre, MD    Family History No family history on file.  Social History Social History   Tobacco Use  . Smoking status: Never Smoker  . Smokeless tobacco: Never Used  Substance Use Topics  . Alcohol use: Not on file  . Drug use: Not on file     Allergies   Patient has no allergy information on record.   Review of Systems Review of Systems  HENT: Positive for sore throat.   All other systems reviewed and are negative.    Physical Exam Updated Vital Signs BP (!) 137/99 (BP Location: Left Arm)   Pulse 63   Temp 97.9 F (36.6 C) (Oral)   Resp 18   LMP 10/14/2017 (Within Days)   SpO2 100%   Physical Exam  Constitutional: She is oriented to person, place, and time. She appears well-developed and  well-nourished.  HENT:  Head: Normocephalic and atraumatic.  Right Ear: External ear normal.  Left Ear: External ear normal.  Nose: Nose normal.  Mouth/Throat: Oropharynx is clear and moist.  Eyes: Pupils are equal, round, and reactive to light. Conjunctivae and EOM are normal.  Neck: Normal range of motion. Neck supple.  Cardiovascular: Normal rate, regular rhythm, normal heart sounds and intact distal pulses.  Pulmonary/Chest: Effort normal and breath sounds normal.  Abdominal: Soft. Bowel sounds are normal.  Musculoskeletal: Normal range of motion.  Neurological: She is alert and oriented to person, place, and time.  Skin: Skin is warm. Capillary refill takes less than 2 seconds.  Psychiatric: She has a normal mood and affect. Her behavior is normal. Judgment and thought content normal.  Nursing note and vitals reviewed.    ED Treatments / Results  Labs (all labs ordered are listed, but only abnormal results are displayed) Labs Reviewed - No data to display  EKG None  Radiology Dg Neck Soft Tissue  Result Date: 10/18/2017 CLINICAL DATA:  Allergic reaction to strong sent yesterday. Bilateral tracheal pain. Dry mouth. EXAM: NECK SOFT TISSUES - 1+ VIEW COMPARISON:  None. FINDINGS: There is no evidence of retropharyngeal soft tissue swelling or epiglottic enlargement. The cervical airway is unremarkable and no radio-opaque  foreign body identified. IMPRESSION: Negative. Electronically Signed   By: Marnee SpringJonathon  Watts M.D.   On: 10/18/2017 09:33    Procedures Procedures (including critical care time)  Medications Ordered in ED Medications  gi cocktail (Maalox,Lidocaine,Donnatal) (30 mLs Oral Given 10/18/17 [id number])     Initial Impression / Assessment and Plan / ED Course  I have reviewed the triage vital signs and the nursing notes.  Pertinent labs & imaging results that were available during my care of the patient were reviewed by me and considered in my medical decision making  (see chart for details).    Upon more discussion with pt, she has an appt with [facility] on July 10 for an EGD.  She has seen [place] for her gerd sx.  Pt may have an allergic component to it as well since sx worsen after strong smells.  Pt will be started on claritin prn.    Final Clinical Impressions(s) / ED Diagnoses   Final diagnoses:  Gastroesophageal reflux disease, esophagitis presence not specified    ED Discharge Orders        Ordered    loratadine (CLARITIN) 10 MG tablet  Daily     10/18/17 0956       Jacalyn LefevreHaviland, Galen Malkowski, MD 10/18/17 1000

## 2017-10-20 ENCOUNTER — Encounter (HOSPITAL_COMMUNITY): Payer: Self-pay | Admitting: Emergency Medicine

## 2017-10-26 ENCOUNTER — Ambulatory Visit: Payer: Self-pay | Admitting: Physician Assistant

## 2017-11-04 ENCOUNTER — Ambulatory Visit: Payer: Self-pay | Admitting: Emergency Medicine

## 2017-12-10 ENCOUNTER — Ambulatory Visit: Payer: Self-pay | Admitting: Emergency Medicine

## 2017-12-10 ENCOUNTER — Other Ambulatory Visit: Payer: Self-pay

## 2017-12-10 ENCOUNTER — Encounter: Payer: Self-pay | Admitting: Emergency Medicine

## 2017-12-10 VITALS — BP 120/78 | HR 65 | Temp 98.5°F | Resp 16 | Ht 61.5 in | Wt 164.0 lb

## 2017-12-10 DIAGNOSIS — G4700 Insomnia, unspecified: Secondary | ICD-10-CM

## 2017-12-10 MED ORDER — HYDROXYZINE PAMOATE 25 MG PO CAPS: 25.0000 mg | ORAL_CAPSULE | Freq: Every evening | ORAL | 0 refills | Status: DC | PRN

## 2017-12-10 NOTE — Progress Notes (Signed)
Yvette Stewart 42 y.o.   Chief Complaint  Patient presents with  . Insomnia    x 2 weeks    HISTORY OF PRESENT ILLNESS: This is a 42 y.o. female complaining of insomnia for the past 2 to 4 weeks.  Stressed out.  HPI   Prior to Admission medications   Medication Sig Start Date End Date Taking? Authorizing Provider  omeprazole (PRILOSEC) 20 MG capsule Take 1 capsule (20 mg total) by mouth daily. 10/02/17  Yes Arby Barrette, MD  pantoprazole (PROTONIX) 20 MG tablet Take 1 tablet (20 mg total) by mouth daily. 11/04/14  Yes Antony Madura, PA-C  amoxicillin (AMOXIL) 500 MG capsule Take 1 capsule (500 mg total) by mouth 3 (three) times daily. Patient not taking: Reported on 12/10/2017 10/09/17   Janne Napoleon, NP  loratadine (CLARITIN) 10 MG tablet Take 1 tablet (10 mg total) by mouth daily. Patient not taking: Reported on 12/10/2017 10/18/17   Jacalyn Lefevre, MD  naproxen (NAPROSYN) 500 MG tablet Take 1 tablet (500 mg total) by mouth 2 (two) times daily. Patient not taking: Reported on 12/10/2017 10/09/17   Janne Napoleon, NP  ranitidine (ZANTAC) 300 MG tablet Take 1 tablet (300 mg total) by mouth at bedtime for 14 days. 10/06/17 10/20/17  Georgina Quint, MD  sucralfate (CARAFATE) 1 GM/10ML suspension Take 10 mLs (1 g total) by mouth 4 (four) times daily -  with meals and at bedtime. Patient not taking: Reported on 12/10/2017 11/04/14   Antony Madura, PA-C  sucralfate (CARAFATE) 1 GM/10ML suspension Take 10 mLs (1 g total) by mouth 4 (four) times daily -  with meals and at bedtime. Patient not taking: Reported on 10/06/2017 10/02/17   Arby Barrette, MD  sucralfate (CARAFATE) 1 GM/10ML suspension Take 1 g by mouth 4 (four) times daily -  with meals and at bedtime.    [provider]    Allergies  Allergen Reactions  . Tylenol [Acetaminophen] Other (See Comments)    Puffy lips    Patient Active Problem List   Diagnosis Date Noted  . Abdominal pain 10/06/2017  .  Gastroesophageal reflux disease with esophagitis 10/06/2017    No past medical history on file.  No past surgical history on file.  Social History   Socioeconomic History  . Marital status: Married    Spouse name: Not on file  . Number of children: Not on file  . Years of education: Not on file  . Highest education level: Not on file  Occupational History  . Not on file  Social Needs  . Financial resource strain: Not on file  . Food insecurity:    Worry: Not on file    Inability: Not on file  . Transportation needs:    Medical: Not on file    Non-medical: Not on file  Tobacco Use  . Smoking status: Never Smoker  . Smokeless tobacco: Never Used  Substance and Sexual Activity  . Alcohol use: No  . Drug use: No  . Sexual activity: Not on file  Lifestyle  . Physical activity:    Days per week: Not on file    Minutes per session: Not on file  . Stress: Not on file  Relationships  . Social connections:    Talks on phone: Not on file    Gets together: Not on file    Attends religious service: Not on file    Active member of club or organization: Not on file    Attends  meetings of clubs or organizations: Not on file    Relationship status: Not on file  . Intimate partner violence:    Fear of current or ex partner: Not on file    Emotionally abused: Not on file    Physically abused: Not on file    Forced sexual activity: Not on file  Other Topics Concern  . Not on file  Social History Narrative   ** Merged History Encounter **        No family history on file.   Review of Systems  Constitutional: Negative.  Negative for chills and fever.  HENT: Positive for ear pain. Negative for sore throat.   Eyes: Negative.  Negative for blurred vision and double vision.  Respiratory: Negative for shortness of breath.   Cardiovascular: Negative for chest pain and palpitations.  Gastrointestinal: Negative for abdominal pain, diarrhea, nausea and vomiting.  Musculoskeletal:  Positive for neck pain.  Skin: Negative.  Negative for rash.  Neurological: Positive for headaches. Negative for dizziness.  Endo/Heme/Allergies: Negative.   Psychiatric/Behavioral: The patient has insomnia.   All other systems reviewed and are negative.  Vitals:   12/10/17 1201  BP: 120/78  Pulse: 65  Resp: 16  Temp: 98.5 F (36.9 C)  SpO2: 100%     Physical Exam  Constitutional: She is oriented to person, place, and time. She appears well-developed and well-nourished.  HENT:  Head: Normocephalic and atraumatic.  Right Ear: Tympanic membrane, external ear and ear canal normal.  Left Ear: Tympanic membrane, external ear and ear canal normal.  Nose: Nose normal.  Mouth/Throat: Oropharynx is clear and moist.  Eyes: Pupils are equal, round, and reactive to light. Conjunctivae and EOM are normal.  Neck: Normal range of motion. Neck supple. No thyromegaly present.  Cardiovascular: Normal rate, regular rhythm and normal heart sounds.  Pulmonary/Chest: Effort normal and breath sounds normal.  Musculoskeletal: Normal range of motion. She exhibits no edema.  Lymphadenopathy:    She has no cervical adenopathy.  Neurological: She is alert and oriented to person, place, and time. No cranial nerve deficit or sensory deficit. She exhibits normal muscle tone. Coordination normal.  Skin: Skin is warm and dry. Capillary refill takes less than 2 seconds. No rash noted.  Psychiatric: She has a normal mood and affect. Her behavior is normal.  Vitals reviewed.    ASSESSMENT & PLAN: Yvette Stewart was seen today for insomnia.  Diagnoses and all orders for this visit:  Insomnia, unspecified type -     hydrOXYzine (VISTARIL) 25 MG capsule; Take 1 capsule (25 mg total) by mouth at bedtime as needed.   Patient Instructions       If you have lab work done today you will be contacted with your lab results within the next 2 weeks.  If you have not heard from us then please contact us. The  fastest way to get your results is to register for My Chart.   IF you received an x-ray today, you will receive an invoice from [facility]. Please contact [facility] at [phone] with questions or concerns regarding your invoice.   IF you received labwork today, you will receive an invoice from [facility]. Please contact [facility] at [phone] with questions or concerns regarding your invoice.   Our billing staff will not be able to assist you with questions regarding bills from these companies.  You will be contacted with the lab results as soon as they are available. The fastest way to get your results is  to activate your My Chart account. Instructions are located on the last page of this paperwork. If you have not heard from [place] regarding the results in 2 weeks, please contact this office.    Insomnio (Insomnia) El insomnio es un trastorno del sueo que causa dificultades para conciliar el sueo o para [place]. Puede producir cansancio (fatiga), falta de energa, dificultad para concentrarse, cambios en el estado de nimo y mal rendimiento escolar o laboral. Hay tres formas diferentes de clasificar el insomnio:  Dificultad para conciliar el sueo.  Dificultad para mantener el sueo.  Despertar muy precoz por la maana. Cualquier tipo de insomnio puede ser a Air cabin crew (crnico) o a Product manager (agudo). Ambos son frecuentes. Generalmente, el insomnio a corto plazo dura tres meses o menos tiempo. El crnico ocurre al menos tres veces por semana durante ms de tres meses. CAUSAS El insomnio puede deberse a otra afeccin, situacin o sustancia, por ejemplo:  Ansiedad.  Algunos medicamentos.  Enfermedad por reflujo gastroesofgico (ERGE) u otras enfermedades gastrointestinales.  Asma y otras enfermedades respiratorias.  Sndrome de las piernas inquietas, apnea del sueo u otros trastornos del sueo.  Dolor crnico.  Menopausia, que puede incluir calores  repentinos.  Ictus.  Consumo excesivo de alcohol, tabaco u drogas ilegales.  Depresin.  Cafena.  Trastornos neurolgicos, como enfermedad de Alzheimer.  Hiperactividad tiroidea (hipertiroidismo). Es posible que la causa del insomnio no se conozca. FACTORES DE RIESGO Los factores de riesgo de tener insomnio incluyen lo siguiente:  El sexo. La mujeres se ven ms afectadas que los hombres.  La edad. El insomnio es ms frecuente a medida que una persona envejece.  El estrs. Esto puede incluir su vida profesional o personal.  Los ingresos. El insomnio es ms frecuente en las personas cuyos ingresos son ms bajos.  La falta de actividad fsica.  Los horarios de trabajo irregulares o los turnos nocturnos.  Los viajes a lugares de diferentes zonas horarias. SIGNOS Y SNTOMAS Si tiene insomnio, el sntoma principal es la dificultad para conciliar el sueo o mantenerlo. Esto puede derivar en otros sntomas, por ejemplo:  Sentirse fatigado.  Ponerse nervioso por [employer] irse a dormir.  No sentirse descansado por la maana.  Tener dificultad para concentrarse.  Sentirse irritable, ansioso o deprimido. TRATAMIENTO El tratamiento para el insomnio depende de la causa. Si se debe a una enfermedad preexistente, el tratamiento se centrar en el abordaje de la enfermedad. El tratamiento tambin puede incluir lo siguiente:  Medicamentos que lo ayuden a dormir.  Asesoramiento psicolgico o terapia.  Cambios en el estilo de vida. INSTRUCCIONES PARA EL CUIDADO EN EL HOGAR  Tome los medicamentos solamente como se lo haya indicado el mdico.  Establezca horarios habituales para dormir y Chiropodist. No tome siestas.  Lleve un registro del sueo ya que podra ser de utilidad para que usted y a su mdico puedan determinar qu podra estar causndole insomnio. Incluya lo siguiente: ? Cundo duerme. ? Cundo se despierta durante la noche. ? Qu tan bien duerme. ? Qu tan relajado  se siente al da siguiente. ? Cualquier efecto secundario de los [employer] toma. ? Lo que usted come y bebe.  Convierta a su habitacin en un lugar cmodo donde sea fcil conciliar el sueo: ? Coloque persianas o cortinas especiales oscuras que impidan la entrada de la luz del exterior. ? Para bloquear los ruidos, use un aparato que reproduce sonidos ambientales o relajantes de fondo. ? Mantenga baja la temperatura.  Haga ejercicio regularmente como  se lo haya indicado el mdico. No haga ejercicio justo antes de la hora de [place].  Utilice tcnicas de relajacin para controlar el estrs. Pdale al mdico que le sugiera algunas tcnicas que sean adecuadas para usted. Estos pueden incluir lo siguiente: ? Ejercicios de respiracin. ? Rutinas para aliviar la tensin muscular. ? Visualizacin de escenas apacibles.  Disminucin del consumo de alcohol, bebidas con cafena y cigarrillos, especialmente cerca de la hora de [place], ya que pueden perturbarle el sueo.  No coma en exceso ni consuma comidas picantes justo antes de la hora de [place]. Esto puede causarle molestias digestivas y dificultades para dormir.  Limite el uso de pantallas antes de la hora de [place]. Esto incluye lo siguiente: ? Mirar televisin. ? Usar el telfono inteligente, la tableta y la computadora.  Siga una rutina. Esto puede ayudarlo a conciliar el sueo ms rpidamente. Intente hacer una actividad tranquila, cepillarse los dientes e irse a la cama a la misma hora todas las noches.  Levntese de la cama si sigue despierto despus de de haber intentado dormirse. Mantenga bajas las luces, pero intente leer o hacer una actividad tranquila. Cuando tenga sueo, regrese a Pharmacist, hospital.  Conduzca con cuidado. No conduzca si est muy somnoliento.  Concurra a todas las visitas de control, segn le indique su mdico. Esto es importante. SOLICITE ATENCIN MDICA SI:  Est cansado durante el da o tiene  dificultades en su rutina diaria debido a la somnolencia.  Sigue teniendo problemas para dormir o Teacher, English as a foreign language. SOLICITE ATENCIN MDICA DE INMEDIATO SI:  Tiene pensamientos serios acerca de lastimarse a usted mismo o daar a Engineer, maintenance (IT). Esta informacin no tiene Theme park manager el consejo del mdico. Asegrese de hacerle al mdico cualquier pregunta que tenga. Document Released: 04/07/2005 Document Revised: 04/28/2014 Document Reviewed: 01/06/2014 Elsevier Interactive Patient Education  2018 Elsevier Inc.       Edwina Barth, MD [facility]

## 2017-12-10 NOTE — Patient Instructions (Addendum)
If you have lab work done today you will be contacted with your lab results within the next 2 weeks.  If you have not heard from [place] then please contact us. The fastest way to get your results is to register for My Chart.   IF you received an x-ray today, you will receive an invoice from [facility]. Please contact [facility] at [phone] with questions or concerns regarding your invoice.   IF you received labwork today, you will receive an invoice from [place]. Please contact [facility] at [phone] with questions or concerns regarding your invoice.   Our billing staff will not be able to assist you with questions regarding bills from these companies.  You will be contacted with the lab results as soon as they are available. The fastest way to get your results is to activate your My Chart account. Instructions are located on the last page of this paperwork. If you have not heard from [place] regarding the results in 2 weeks, please contact this office.    Insomnio (Insomnia) El insomnio es un trastorno del sueo que causa dificultades para conciliar el sueo o para [place]. Puede producir cansancio (fatiga), falta de energa, dificultad para concentrarse, cambios en el estado de nimo y mal rendimiento escolar o laboral. Hay tres formas diferentes de clasificar el insomnio:  Dificultad para conciliar el sueo.  Dificultad para mantener el sueo.  Despertar muy precoz por la maana. Cualquier tipo de insomnio puede ser a Air cabin crew (crnico) o a Product manager (agudo). Ambos son frecuentes. Generalmente, el insomnio a corto plazo dura tres meses o menos tiempo. El crnico ocurre al menos tres veces por semana durante ms de tres meses. CAUSAS El insomnio puede deberse a otra afeccin, situacin o sustancia, por ejemplo:  Ansiedad.  Algunos medicamentos.  Enfermedad por reflujo gastroesofgico (ERGE) u otras enfermedades gastrointestinales.  Asma y otras  enfermedades respiratorias.  Sndrome de las piernas inquietas, apnea del sueo u otros trastornos del sueo.  Dolor crnico.  Menopausia, que puede incluir calores repentinos.  Ictus.  Consumo excesivo de alcohol, tabaco u drogas ilegales.  Depresin.  Cafena.  Trastornos neurolgicos, como enfermedad de Alzheimer.  Hiperactividad tiroidea (hipertiroidismo). Es posible que la causa del insomnio no se conozca. FACTORES DE RIESGO Los factores de riesgo de tener insomnio incluyen lo siguiente:  El sexo. La mujeres se ven ms afectadas que los hombres.  La edad. El insomnio es ms frecuente a medida que una persona envejece.  El estrs. Esto puede incluir su vida profesional o personal.  Los ingresos. El insomnio es ms frecuente en las personas cuyos ingresos son ms bajos.  La falta de actividad fsica.  Los horarios de trabajo irregulares o los turnos nocturnos.  Los viajes a lugares de diferentes zonas horarias. SIGNOS Y SNTOMAS Si tiene insomnio, el sntoma principal es la dificultad para conciliar el sueo o mantenerlo. Esto puede derivar en otros sntomas, por ejemplo:  Sentirse fatigado.  Ponerse nervioso por [employer] irse a dormir.  No sentirse descansado por la maana.  Tener dificultad para concentrarse.  Sentirse irritable, ansioso o deprimido. TRATAMIENTO El tratamiento para el insomnio depende de la causa. Si se debe a una enfermedad preexistente, el tratamiento se centrar en el abordaje de la enfermedad. El tratamiento tambin puede incluir lo siguiente:  Medicamentos que lo ayuden a dormir.  Asesoramiento psicolgico o terapia.  Cambios en el estilo de vida. INSTRUCCIONES PARA EL CUIDADO EN EL HOGAR  Tome los medicamentos solamente  como se lo haya indicado el mdico.  Establezca horarios habituales para dormir y Chiropodistdespertarse. No tome siestas.  Lleve un registro del sueo ya que podra ser de utilidad para que usted y a su mdico puedan  determinar qu podra estar causndole insomnio. Incluya lo siguiente: ? Cundo duerme. ? Cundo se despierta durante la noche. ? Qu tan bien duerme. ? Qu tan relajado se siente al da siguiente. ? Cualquier efecto secundario de los [employer] que toma. ? Lo que usted come y bebe.  Convierta a su habitacin en un lugar cmodo donde sea fcil conciliar el sueo: ? Coloque persianas o cortinas especiales oscuras que impidan la entrada de la luz del exterior. ? Para bloquear los ruidos, use un aparato que reproduce sonidos ambientales o relajantes de fondo. ? Mantenga baja la temperatura.  Haga ejercicio regularmente como se lo haya indicado el mdico. No haga ejercicio justo antes de la hora de [place].  Utilice tcnicas de relajacin para controlar el estrs. Pdale al mdico que le sugiera algunas tcnicas que sean adecuadas para usted. Estos pueden incluir lo siguiente: ? Ejercicios de respiracin. ? Rutinas para aliviar la tensin muscular. ? Visualizacin de escenas apacibles.  Disminucin del consumo de alcohol, bebidas con cafena y cigarrillos, especialmente cerca de la hora de [place]arse, ya que pueden perturbarle el sueo.  No coma en exceso ni consuma comidas picantes justo antes de la hora de [place]. Esto puede causarle molestias digestivas y dificultades para dormir.  Limite el uso de pantallas antes de la hora de [place]. Esto incluye lo siguiente: ? Mirar televisin. ? Usar el telfono inteligente, la tableta y la computadora.  Siga una rutina. Esto puede ayudarlo a conciliar el sueo ms rpidamente. Intente hacer una actividad tranquila, cepillarse los dientes e irse a la cama a la misma hora todas las noches.  Levntese de la cama si sigue despierto despus de 15minutos de haber intentado dormirse. Mantenga bajas las luces, pero intente leer o hacer una actividad tranquila. Cuando tenga sueo, regrese a Pharmacist, hospitalla cama.  Conduzca con cuidado. No conduzca si est muy  somnoliento.  Concurra a todas las visitas de control, segn le indique su mdico. Esto es importante. SOLICITE ATENCIN MDICA SI:  Est cansado durante el da o tiene dificultades en su rutina diaria debido a la somnolencia.  Sigue teniendo problemas para dormir o Teacher, English as a foreign languageestos empeoran. SOLICITE ATENCIN MDICA DE INMEDIATO SI:  Tiene pensamientos serios acerca de lastimarse a usted mismo o daar a Engineer, maintenance (IT)otra persona. Esta informacin no tiene Theme park managercomo fin reemplazar el consejo del mdico. Asegrese de hacerle al mdico cualquier pregunta que tenga. Document Released: 04/07/2005 Document Revised: 04/28/2014 Document Reviewed: 01/06/2014 Elsevier Interactive Patient Education  [employer]2018 Elsevier Inc.

## 2018-04-02 ENCOUNTER — Telehealth (HOSPITAL_COMMUNITY): Payer: Self-pay

## 2018-04-02 NOTE — Telephone Encounter (Signed)
Left message with patient with interpreter Natale LayErika McReynolds for patient to call us back to [facility].

## 2020-02-23 ENCOUNTER — Encounter (HOSPITAL_COMMUNITY): Payer: Self-pay

## 2020-02-23 ENCOUNTER — Emergency Department (HOSPITAL_COMMUNITY)
Admission: EM | Admit: 2020-02-23 | Discharge: 2020-02-23 | Disposition: A | Discharge status: Home / Self Care | Payer: Self-pay | Attending: Emergency Medicine | Admitting: Emergency Medicine

## 2020-02-23 ENCOUNTER — Other Ambulatory Visit: Payer: Self-pay

## 2020-02-23 DIAGNOSIS — R300 Dysuria: Secondary | ICD-10-CM

## 2020-02-23 LAB — COMPREHENSIVE METABOLIC PANEL
ALT: 42 U/L (ref 0–44)
AST: 27 U/L (ref 15–41)
Albumin: 4.5 g/dL (ref 3.5–5.0)
Alkaline Phosphatase: 103 U/L (ref 38–126)
Anion gap: 9 (ref 5–15)
BUN: 9 mg/dL (ref 6–20)
CO2: 26 mmol/L (ref 22–32)
Calcium: 10.5 mg/dL — ABNORMAL HIGH (ref 8.9–10.3)
Chloride: 100 mmol/L (ref 98–111)
Creatinine, Ser: 0.48 mg/dL (ref 0.44–1.00)
GFR, Estimated: >60 mL/min (ref >60)
Glucose, Bld: 118 mg/dL — ABNORMAL HIGH (ref 70–99)
Potassium: 3.7 mmol/L (ref 3.5–5.1)
Sodium: 135 mmol/L (ref 135–145)
Total Bilirubin: 0.4 mg/dL (ref 0.3–1.2)
Total Protein: 7.9 g/dL (ref 6.5–8.1)

## 2020-02-23 LAB — URINALYSIS, ROUTINE W REFLEX MICROSCOPIC
Bilirubin Urine: NEGATIVE
Glucose, UA: NEGATIVE mg/dL
Ketones, ur: NEGATIVE mg/dL
Leukocytes,Ua: NEGATIVE
Nitrite: NEGATIVE
Protein, ur: NEGATIVE mg/dL
Specific Gravity, Urine: 1.003 — ABNORMAL LOW (ref 1.005–1.030)
pH: 7 (ref 5.0–8.0)

## 2020-02-23 LAB — CBC WITH DIFFERENTIAL/PLATELET
Abs Immature Granulocytes: 0.07 10*3/uL (ref 0.00–0.07)
Basophils Absolute: 0 10*3/uL (ref 0.0–0.1)
Basophils Relative: 1 %
Eosinophils Absolute: 0.1 10*3/uL (ref 0.0–0.5)
Eosinophils Relative: 2 %
HCT: 41.2 % (ref 36.0–46.0)
Hemoglobin: 13.7 g/dL (ref 12.0–15.0)
Immature Granulocytes: 1 %
Lymphocytes Relative: 30 %
Lymphs Abs: 2 10*3/uL (ref 0.7–4.0)
MCH: 31.2 pg (ref 26.0–34.0)
MCHC: 33.3 g/dL (ref 30.0–36.0)
MCV: 93.8 fL (ref 80.0–100.0)
Monocytes Absolute: 0.6 10*3/uL (ref 0.1–1.0)
Monocytes Relative: 9 %
Neutro Abs: 3.8 10*3/uL (ref 1.7–7.7)
Neutrophils Relative %: 57 %
Platelets: 232 10*3/uL (ref 150–400)
RBC: 4.39 MIL/uL (ref 3.87–5.11)
RDW: 13.5 % (ref 11.5–15.5)
WBC: 6.5 10*3/uL (ref 4.0–10.5)
nRBC: 0 % (ref 0.0–0.2)

## 2020-02-23 LAB — I-STAT BETA HCG BLOOD, ED (MC, WL, AP ONLY): I-stat hCG, quantitative: <5 m[IU]/mL (ref <5)

## 2020-02-23 LAB — LIPASE, BLOOD: Lipase: 21 U/L (ref 11–51)

## 2020-02-23 MED ORDER — CEPHALEXIN 500 MG PO CAPS: 500.0000 mg | ORAL_CAPSULE | Freq: Four times a day (QID) | ORAL | 0 refills | Status: DC

## 2020-02-23 NOTE — Discharge Instructions (Signed)
Please return for any problem.  °

## 2020-02-23 NOTE — ED Triage Notes (Signed)
Spanish interpreter needed. Pt's daughter at bedside and speaks [place]. Pt ambulates to ED with cc of LLQ abd pain and hematuria lasting 2 days. Endorses some itching. Denies foul odor. Last BM was yesterday and pt reports it was normal. Also endorses 7/10 headache and neck ache lasting appx. 2 hours.

## 2020-02-23 NOTE — ED Provider Notes (Signed)
[facility]-EMERGENCY DEPT Provider Note   CSN: [id number] Arrival date & time: 02/23/20  9242     History No chief complaint on file.   Yvette Stewart is a 44 y.o. female.  44 year old female with prior medical history as detailed below presents for evaluation.  Patient reports vague lower abdominal discomfort for the last 2 days.  This is associated with increased frequency of urination.  She also reports burning with urination.  Today she noticed small amount of blood in the urine itself.  She denies fever.  She denies recent UTI.    The history is provided by the patient and medical records. A language interpreter was used.  Hematuria This is a new problem. The current episode started 2 days ago. The problem occurs constantly. The problem has not changed since onset.Associated symptoms include abdominal pain. Pertinent negatives include no chest pain. Nothing aggravates the symptoms. Nothing relieves the symptoms.       No past medical history on file.  Patient Active Problem List   Diagnosis Date Noted  . Insomnia 12/10/2017  . Abdominal pain 10/06/2017  . Gastroesophageal reflux disease with esophagitis 10/06/2017    No past surgical history on file.   OB History   No obstetric history on file.     No family history on file.  Social History   Tobacco Use  . Smoking status: Never Smoker  . Smokeless tobacco: Never Used  Substance Use Topics  . Alcohol use: No  . Drug use: No    Home Medications Prior to Admission medications   Medication Sig Start Date End Date Taking? Authorizing Provider  amoxicillin (AMOXIL) 500 MG capsule Take 1 capsule (500 mg total) by mouth 3 (three) times daily. Patient not taking: Reported on 12/10/2017 10/09/17   Janne Napoleon, NP  hydrOXYzine (VISTARIL) 25 MG capsule Take 1 capsule (25 mg total) by mouth at bedtime as needed. 12/10/17   Georgina Quint, MD  loratadine (CLARITIN) 10 MG tablet  Take 1 tablet (10 mg total) by mouth daily. Patient not taking: Reported on 12/10/2017 10/18/17   Jacalyn Lefevre, MD  naproxen (NAPROSYN) 500 MG tablet Take 1 tablet (500 mg total) by mouth 2 (two) times daily. Patient not taking: Reported on 12/10/2017 10/09/17   Janne Napoleon, NP  omeprazole (PRILOSEC) 20 MG capsule Take 1 capsule (20 mg total) by mouth daily. 10/02/17   Arby Barrette, MD  pantoprazole (PROTONIX) 20 MG tablet Take 1 tablet (20 mg total) by mouth daily. 11/04/14   Antony Madura, PA-C  ranitidine (ZANTAC) 300 MG tablet Take 1 tablet (300 mg total) by mouth at bedtime for 14 days. 10/06/17 10/20/17  Georgina Quint, MD  sucralfate (CARAFATE) 1 GM/10ML suspension Take 10 mLs (1 g total) by mouth 4 (four) times daily -  with meals and at bedtime. Patient not taking: Reported on 12/10/2017 11/04/14   Antony Madura, PA-C  sucralfate (CARAFATE) 1 GM/10ML suspension Take 10 mLs (1 g total) by mouth 4 (four) times daily -  with meals and at bedtime. Patient not taking: Reported on 10/06/2017 10/02/17   Arby Barrette, MD  sucralfate (CARAFATE) 1 GM/10ML suspension Take 1 g by mouth 4 (four) times daily -  with meals and at bedtime.    [provider]    Allergies    Tylenol [acetaminophen]  Review of Systems   Review of Systems  Cardiovascular: Negative for chest pain.  Gastrointestinal: Positive for abdominal pain.  Genitourinary: Positive for  hematuria.  All other systems reviewed and are negative.   Physical Exam Updated Vital Signs BP (!) 139/99 (BP Location: Right Arm)   Pulse 72   Temp 98 F (36.7 C) (Oral)   Resp 16   SpO2 100%   Physical Exam Vitals and nursing note reviewed.  Constitutional:      General: She is not in acute distress.    Appearance: Normal appearance. She is well-developed.  HENT:     Head: Normocephalic and atraumatic.  Eyes:     Conjunctiva/sclera: Conjunctivae normal.     Pupils: Pupils are equal, round, and reactive to light.    Cardiovascular:     Rate and Rhythm: Normal rate and regular rhythm.     Heart sounds: Normal heart sounds.  Pulmonary:     Effort: Pulmonary effort is normal. No respiratory distress.     Breath sounds: Normal breath sounds.  Abdominal:     General: Abdomen is flat. There is no distension.     Palpations: Abdomen is soft.     Tenderness: There is no abdominal tenderness.  Musculoskeletal:        General: No deformity. Normal range of motion.     Cervical back: Normal range of motion and neck supple.  Skin:    General: Skin is warm and dry.  Neurological:     General: No focal deficit present.     Mental Status: She is alert and oriented to person, place, and time.     ED Results / Procedures / Treatments   Labs (all labs ordered are listed, but only abnormal results are displayed) Labs Reviewed  URINALYSIS, ROUTINE W REFLEX MICROSCOPIC - Abnormal; Notable for the following components:      Result Value   Color, Urine STRAW (*)    Specific Gravity, Urine 1.003 (*)    Hgb urine dipstick LARGE (*)    Bacteria, UA RARE (*)    All other components within normal limits  COMPREHENSIVE METABOLIC PANEL - Abnormal; Notable for the following components:   Glucose, Bld 118 (*)    Calcium 10.5 (*)    All other components within normal limits  LIPASE, BLOOD  CBC WITH DIFFERENTIAL/PLATELET  I-STAT BETA HCG BLOOD, ED ([facility], [facility], [facility] ONLY)    EKG None  Radiology No results found.  Procedures Procedures (including critical care time)  Medications Ordered in ED Medications - No data to display  ED Course  I have reviewed the triage vital signs and the nursing notes.  Pertinent labs & imaging results that were available during my care of the patient were reviewed by me and considered in my medical decision making (see chart for details).    MDM Rules/Calculators/A&P                          MDM  Screen complete  Javaya Oregon was evaluated in Emergency  Department on 02/23/2020 for the symptoms described in the history of present illness. She was evaluated in the context of the global COVID-19 pandemic, which necessitated consideration that the patient might be at risk for infection with the SARS-CoV-2 virus that causes COVID-19. Institutional protocols and algorithms that pertain to the evaluation of patients at risk for COVID-19 are in a state of rapid change based on information released by regulatory bodies including the CDC and federal and state organizations. These policies and algorithms were followed during the patient's care in the ED.   Patient's described symptoms  are most consistent with likely early UTI.  Work-up in the ED on the whole is without significant abnormality.  Given patient's presentation will prescribe a course of antibiotics for treatment of presumed UTI.  Patient feels improved following her ED evaluation. Repeat abdominal exam remains benign. Patient does understand need for close follow-up. Strict return precautions given and understood.   Final Clinical Impression(s) / ED Diagnoses Final diagnoses:  Dysuria    Rx / DC Orders ED Discharge Orders         Ordered    cephALEXin (KEFLEX) 500 MG capsule  4 times daily        02/23/20 1052           Wynetta Fines, MD 02/23/20 1054

## 2020-03-19 ENCOUNTER — Emergency Department (HOSPITAL_COMMUNITY)
Admission: EM | Admit: 2020-03-19 | Discharge: 2020-03-19 | Disposition: A | Discharge status: Home / Self Care | Payer: Self-pay | Attending: Emergency Medicine | Admitting: Emergency Medicine

## 2020-03-19 ENCOUNTER — Encounter (HOSPITAL_COMMUNITY): Payer: Self-pay

## 2020-03-19 ENCOUNTER — Other Ambulatory Visit: Payer: Self-pay

## 2020-03-19 ENCOUNTER — Emergency Department (HOSPITAL_COMMUNITY): Payer: Self-pay

## 2020-03-19 DIAGNOSIS — R110 Nausea: Secondary | ICD-10-CM | POA: Insufficient documentation

## 2020-03-19 DIAGNOSIS — N23 Unspecified renal colic: Secondary | ICD-10-CM | POA: Insufficient documentation

## 2020-03-19 LAB — CBC WITH DIFFERENTIAL/PLATELET
Abs Immature Granulocytes: 0.06 10*3/uL (ref 0.00–0.07)
Basophils Absolute: 0 10*3/uL (ref 0.0–0.1)
Basophils Relative: 1 %
Eosinophils Absolute: 0.1 10*3/uL (ref 0.0–0.5)
Eosinophils Relative: 1 %
HCT: 39.9 % (ref 36.0–46.0)
Hemoglobin: 13.2 g/dL (ref 12.0–15.0)
Immature Granulocytes: 1 %
Lymphocytes Relative: 26 %
Lymphs Abs: 2.2 10*3/uL (ref 0.7–4.0)
MCH: 30.7 pg (ref 26.0–34.0)
MCHC: 33.1 g/dL (ref 30.0–36.0)
MCV: 92.8 fL (ref 80.0–100.0)
Monocytes Absolute: 0.5 10*3/uL (ref 0.1–1.0)
Monocytes Relative: 5 %
Neutro Abs: 5.6 10*3/uL (ref 1.7–7.7)
Neutrophils Relative %: 66 %
Platelets: 241 10*3/uL (ref 150–400)
RBC: 4.3 MIL/uL (ref 3.87–5.11)
RDW: 13.6 % (ref 11.5–15.5)
WBC: 8.3 10*3/uL (ref 4.0–10.5)
nRBC: 0 % (ref 0.0–0.2)

## 2020-03-19 LAB — COMPREHENSIVE METABOLIC PANEL
ALT: 43 U/L (ref 0–44)
AST: 29 U/L (ref 15–41)
Albumin: 4.5 g/dL (ref 3.5–5.0)
Alkaline Phosphatase: 109 U/L (ref 38–126)
Anion gap: 9 (ref 5–15)
BUN: 11 mg/dL (ref 6–20)
CO2: 23 mmol/L (ref 22–32)
Calcium: 10.3 mg/dL (ref 8.9–10.3)
Chloride: 104 mmol/L (ref 98–111)
Creatinine, Ser: 0.43 mg/dL — ABNORMAL LOW (ref 0.44–1.00)
GFR, Estimated: >60 mL/min (ref >60)
Glucose, Bld: 164 mg/dL — ABNORMAL HIGH (ref 70–99)
Potassium: 4 mmol/L (ref 3.5–5.1)
Sodium: 136 mmol/L (ref 135–145)
Total Bilirubin: 0.4 mg/dL (ref 0.3–1.2)
Total Protein: 7.9 g/dL (ref 6.5–8.1)

## 2020-03-19 LAB — URINALYSIS, ROUTINE W REFLEX MICROSCOPIC
Bilirubin Urine: NEGATIVE
Glucose, UA: >=500 mg/dL — AB
Hgb urine dipstick: NEGATIVE
Ketones, ur: NEGATIVE mg/dL
Leukocytes,Ua: NEGATIVE
Nitrite: NEGATIVE
Protein, ur: 30 mg/dL — AB
Specific Gravity, Urine: 1.014 (ref 1.005–1.030)
pH: 7 (ref 5.0–8.0)

## 2020-03-19 LAB — HCG, QUANTITATIVE, PREGNANCY: hCG, Beta Chain, Quant, S: <1 m[IU]/mL (ref <5)

## 2020-03-19 MED ORDER — KETOROLAC TROMETHAMINE 30 MG/ML IJ SOLN
15.0000 mg | Freq: Once | INTRAMUSCULAR | Status: AC
  Administered 2020-03-19: 15 mg via INTRAVENOUS
  Filled 2020-03-19: qty 1

## 2020-03-19 MED ORDER — ONDANSETRON HCL 4 MG/2ML IJ SOLN
4.0000 mg | Freq: Once | INTRAMUSCULAR | Status: AC
  Administered 2020-03-19: 4 mg via INTRAVENOUS
  Filled 2020-03-19: qty 2

## 2020-03-19 MED ORDER — TAMSULOSIN HCL 0.4 MG PO CAPS: 0.4000 mg | ORAL_CAPSULE | Freq: Every day | ORAL | 0 refills | Status: DC

## 2020-03-19 MED ORDER — OXYCODONE HCL 5 MG PO TABS: 5.0000 mg | ORAL_TABLET | Freq: Two times a day (BID) | ORAL | 0 refills | Status: AC | PRN

## 2020-03-19 MED ORDER — NAPROXEN 375 MG PO TABS: 375.0000 mg | ORAL_TABLET | Freq: Two times a day (BID) | ORAL | 0 refills | Status: DC

## 2020-03-19 MED ORDER — ONDANSETRON 8 MG PO TBDP: 8.0000 mg | ORAL_TABLET | Freq: Three times a day (TID) | ORAL | 0 refills | Status: DC | PRN

## 2020-03-19 NOTE — ED Provider Notes (Addendum)
[facility]-EMERGENCY DEPT Provider Note   CSN: [id number] Arrival date & time: 03/19/20  9937     History Chief Complaint  Patient presents with  . Abdominal Pain  . Nausea    Yvette Stewart is a 44 y.o. female.  HPI     Translation services utilized.  44 year old female comes in a chief complaint of nausea and abdominal pain.  Patient was seen in the ER 2 weeks ago.  She reports at that time she was discharged with medications for UTI.  Her urinary symptoms have improved but her abdominal pain has continued.  The pain is intermittent and starts in the leg/groin area and radiates up.  The pain is sharp, intermittent and unprovoked.  She has associated nausea and vomiting.  She denies fevers chills, abdominal surgery.  No history of kidney stones.  History reviewed. No pertinent past medical history.  Patient Active Problem List   Diagnosis Date Noted  . Insomnia 12/10/2017  . Abdominal pain 10/06/2017  . Gastroesophageal reflux disease with esophagitis 10/06/2017    History reviewed. No pertinent surgical history.   OB History   No obstetric history on file.     History reviewed. No pertinent family history.  Social History   Tobacco Use  . Smoking status: Never Smoker  . Smokeless tobacco: Never Used  Vaping Use  . Vaping Use: Never used  Substance Use Topics  . Alcohol use: No  . Drug use: No    Home Medications Prior to Admission medications   Medication Sig Start Date End Date Taking? Authorizing Provider  amoxicillin (AMOXIL) 500 MG capsule Take 1 capsule (500 mg total) by mouth 3 (three) times daily. Patient not taking: Reported on 12/10/2017 10/09/17   Janne Napoleon, NP  cephALEXin (KEFLEX) 500 MG capsule Take 1 capsule (500 mg total) by mouth 4 (four) times daily. Patient not taking: Reported on 03/19/2020 02/23/20   Wynetta Fines, MD  hydrOXYzine (VISTARIL) 25 MG capsule Take 1 capsule (25 mg total) by mouth at  bedtime as needed. Patient not taking: Reported on 02/23/2020 12/10/17   Georgina Quint, MD  loratadine (CLARITIN) 10 MG tablet Take 1 tablet (10 mg total) by mouth daily. Patient not taking: Reported on 12/10/2017 10/18/17   Jacalyn Lefevre, MD  naproxen (NAPROSYN) 375 MG tablet Take 1 tablet (375 mg total) by mouth 2 (two) times daily. 03/19/20   Derwood Kaplan, MD  omeprazole (PRILOSEC) 20 MG capsule Take 1 capsule (20 mg total) by mouth daily. Patient not taking: Reported on 02/23/2020 10/02/17   Arby Barrette, MD  ondansetron (ZOFRAN ODT) 8 MG disintegrating tablet Take 1 tablet (8 mg total) by mouth every 8 (eight) hours as needed for nausea. 03/19/20   Derwood Kaplan, MD  oxyCODONE (ROXICODONE) 5 MG immediate release tablet Take 1 tablet (5 mg total) by mouth every 12 (twelve) hours as needed for up to 5 days for severe pain. 03/19/20 03/24/20  Derwood Kaplan, MD  pantoprazole (PROTONIX) 20 MG tablet Take 1 tablet (20 mg total) by mouth daily. Patient not taking: Reported on 03/19/2020 11/04/14   Antony Madura, PA-C  ranitidine (ZANTAC) 300 MG tablet Take 1 tablet (300 mg total) by mouth at bedtime for 14 days. Patient not taking: Reported on 03/19/2020 10/06/17 10/20/17  Georgina Quint, MD  sucralfate (CARAFATE) 1 GM/10ML suspension Take 10 mLs (1 g total) by mouth 4 (four) times daily -  with meals and at bedtime. Patient not taking: Reported on 03/19/2020  11/04/14   Antony Madura, PA-C  sucralfate (CARAFATE) 1 GM/10ML suspension Take 10 mLs (1 g total) by mouth 4 (four) times daily -  with meals and at bedtime. Patient not taking: Reported on 03/19/2020 10/02/17   Arby Barrette, MD  tamsulosin (FLOMAX) 0.4 MG CAPS capsule Take 1 capsule (0.4 mg total) by mouth daily. 03/19/20   Derwood Kaplan, MD    Allergies    Tylenol [acetaminophen]  Review of Systems   Review of Systems  Constitutional: Positive for activity change.  Respiratory: Negative for shortness of breath.     Cardiovascular: Negative for chest pain.  Gastrointestinal: Positive for abdominal pain, nausea and vomiting.  Genitourinary: Negative for dysuria.  All other systems reviewed and are negative.   Physical Exam Updated Vital Signs BP (!) 130/102   Pulse 77   Temp 97.8 F (36.6 C) (Oral)   Resp 16   SpO2 100%   Physical Exam Vitals and nursing note reviewed.  Constitutional:      Appearance: She is well-developed.  HENT:     Head: Normocephalic and atraumatic.  Cardiovascular:     Rate and Rhythm: Normal rate.  Pulmonary:     Effort: Pulmonary effort is normal.  Abdominal:     General: Bowel sounds are normal.     Tenderness: There is abdominal tenderness in the left upper quadrant and left lower quadrant. There is no guarding.  Musculoskeletal:     Cervical back: Normal range of motion and neck supple.  Skin:    General: Skin is warm and dry.  Neurological:     Mental Status: She is alert and oriented to person, place, and time.     ED Results / Procedures / Treatments   Labs (all labs ordered are listed, but only abnormal results are displayed) Labs Reviewed  COMPREHENSIVE METABOLIC PANEL - Abnormal; Notable for the following components:      Result Value   Glucose, Bld 164 (*)    Creatinine, Ser 0.43 (*)    All other components within normal limits  URINALYSIS, ROUTINE W REFLEX MICROSCOPIC - Abnormal; Notable for the following components:   Glucose, UA >=500 (*)    Protein, ur 30 (*)    Bacteria, UA RARE (*)    All other components within normal limits  CBC WITH DIFFERENTIAL/PLATELET  HCG, QUANTITATIVE, PREGNANCY    EKG None  Radiology No results found.  Procedures Procedures (including critical care time)  Medications Ordered in ED Medications  ketorolac (TORADOL) 30 MG/ML injection 15 mg (15 mg Intravenous Given 03/19/20 0828)  ondansetron (ZOFRAN) injection 4 mg (4 mg Intravenous Given 03/19/20 1017)    ED Course  I have reviewed the triage  vital signs and the nursing notes.  Pertinent labs & imaging results that were available during my care of the patient were reviewed by me and considered in my medical decision making (see chart for details).  Clinical Course as of Mar 24 1507  Mon Mar 19, 2020  1236 The patient appears reasonably screened and/or stabilized for discharge and I doubt any other medical condition or other [facility] requiring further screening, evaluation, or treatment in the ED at this time prior to discharge.  Results from the ER workup discussed with the patient face to face and all questions answered to the best of my ability. The patient is safe for discharge with strict return precautions.  CT Renal Stone Study [AN]  1237 Translation service utilized for the update.    [AN]  Clinical Course User Index [AN] Derwood Kaplan, MD   MDM Rules/Calculators/A&P                          44 year old female comes in a chief complaint of abdominal pain.  She is having left-sided abdominal tenderness without rebound or guarding.  She is noted to be actively nauseated and had emesis while I was assessing her.  She does not look toxic and is afebrile.  Recently treated for UTI.  Based on history and exam differential diagnosis primarily includes diverticulitis, kidney stones.  CT renal stone protocol ordered.  Final Clinical Impression(s) / ED Diagnoses Final diagnoses:  Ureteral colic    Rx / DC Orders ED Discharge Orders         Ordered    oxyCODONE (ROXICODONE) 5 MG immediate release tablet  Every 12 hours PRN        03/19/20 1353    tamsulosin (FLOMAX) 0.4 MG CAPS capsule  Daily        03/19/20 1353    ondansetron (ZOFRAN ODT) 8 MG disintegrating tablet  Every 8 hours PRN        03/19/20 1353    naproxen (NAPROSYN) 375 MG tablet  2 times daily        03/19/20 1353           Derwood Kaplan, MD 03/19/20 4562    Derwood Kaplan, MD 03/23/20 5638

## 2020-03-19 NOTE — ED Triage Notes (Signed)
Pt c/o lower abdominal pain and nausea since yesterday. Denies diarrhea.

## 2020-03-19 NOTE — Discharge Instructions (Signed)
We saw you in the ER for the abdominal pain. °Our results indicate that you have a kidney stone. °We were able to get your pain is relative control, and we can safely send you home. ° °Take the meds prescribed. °Set up an appointment with the Urologist. °If the pain is unbearable, you start having fevers, chills, and are unable to keep any meds down - then return to the ER. °  °

## 2021-02-07 IMAGING — CT CT RENAL STONE PROTOCOL
2 of 4 series · 17 of 46 positions shown, 19 images · non-contrast
Comparison: None.

CLINICAL DATA: Acute left flank pain.

EXAM:
CT ABDOMEN AND PELVIS WITHOUT CONTRAST
TECHNIQUE: Multidetector CT imaging of the abdomen and pelvis was performed
following the standard protocol without IV contrast.

[Series 2: axial st · axial · 0.87mm/px · z∈[+1238,+1658]mm · 14 of 98 slices shown, 16 images]
[im 7/98  soft-tissue]
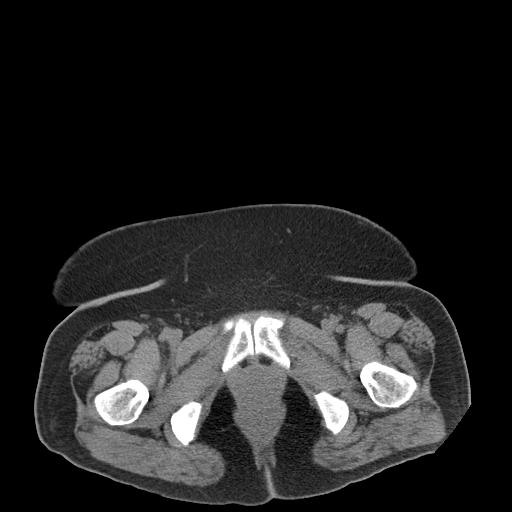
[im 7/98  bone]
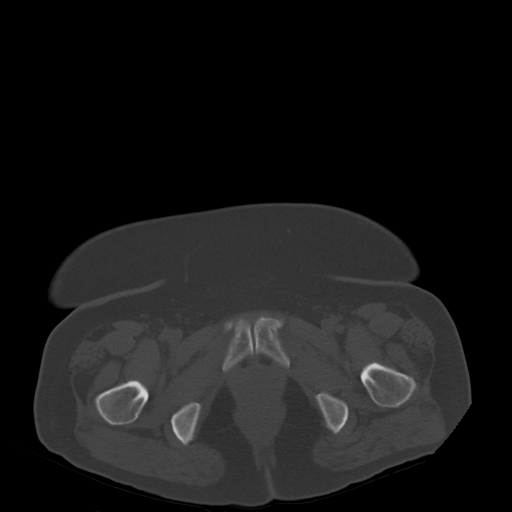
[im 13/98  soft-tissue]
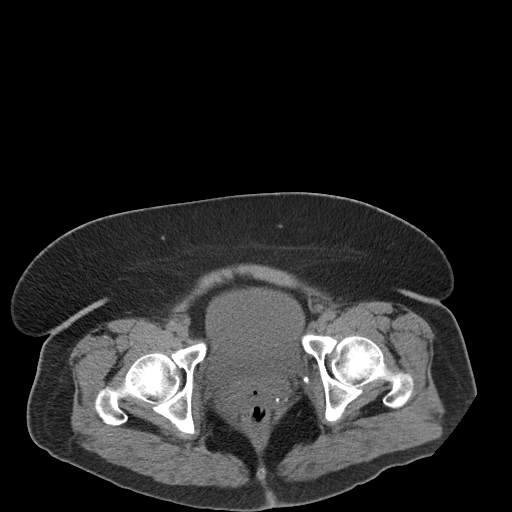
[im 19/98  soft-tissue]
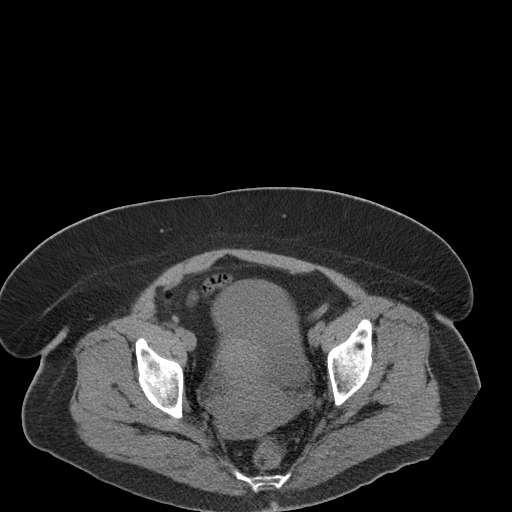
[im 25/98  soft-tissue]
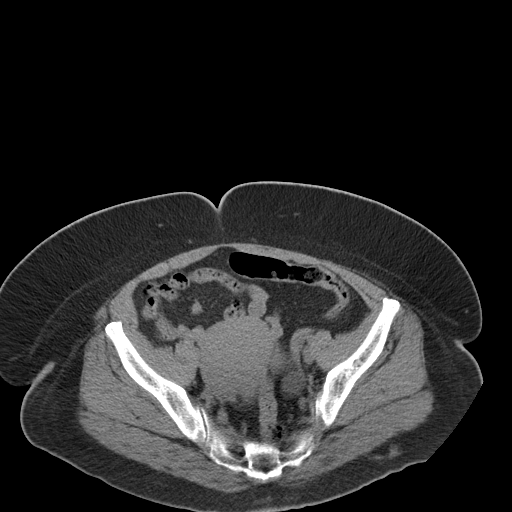
[im 31/98  soft-tissue]
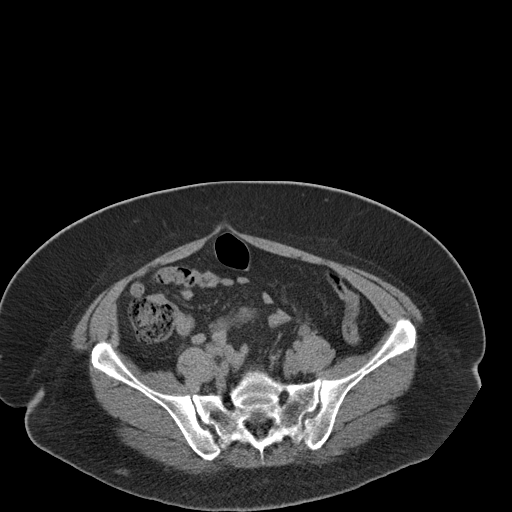
[im 37/98  soft-tissue]
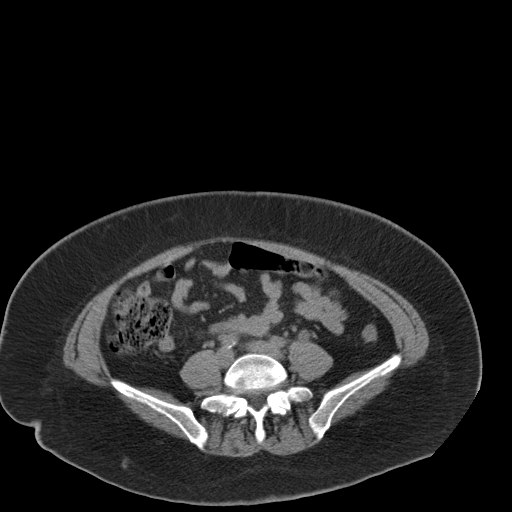
[im 43/98  soft-tissue]
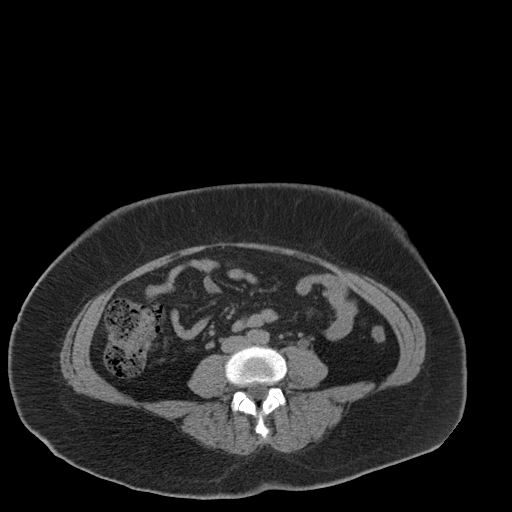
[im 55/98  soft-tissue]
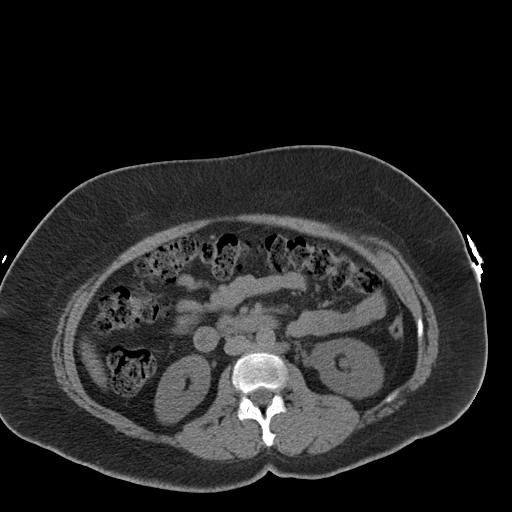
[im 61/98  soft-tissue]
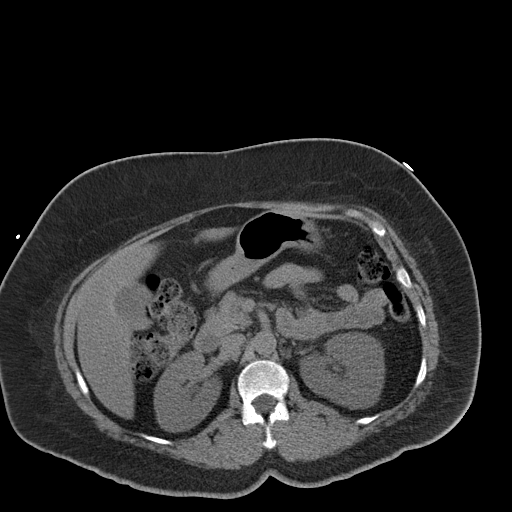
[im 61/98  bone]
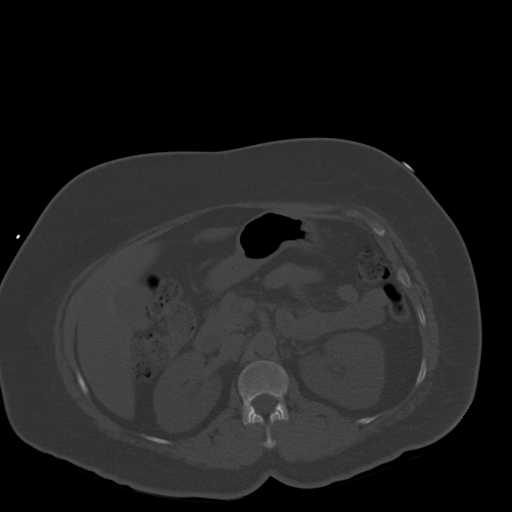
[im 67/98  soft-tissue]
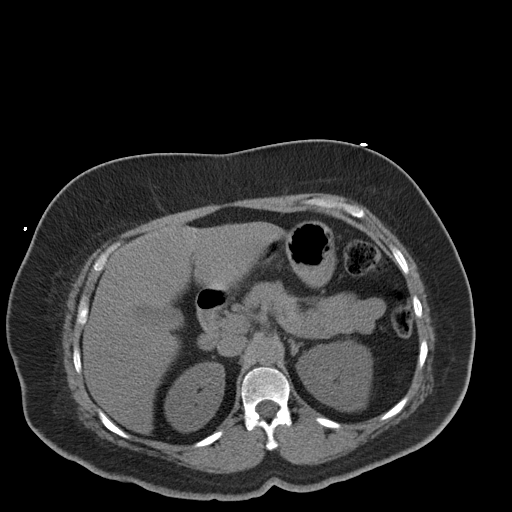
[im 73/98  soft-tissue]
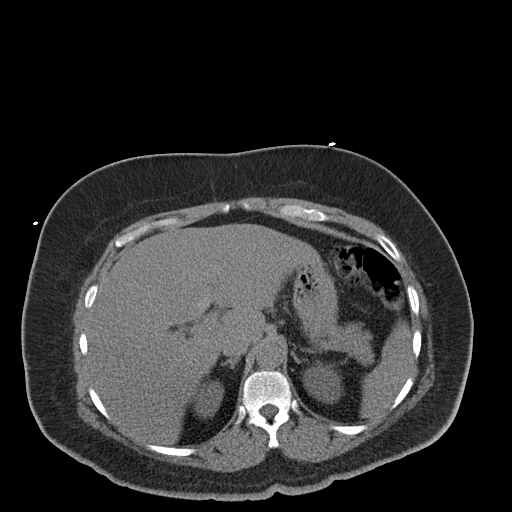
[im 79/98  soft-tissue]
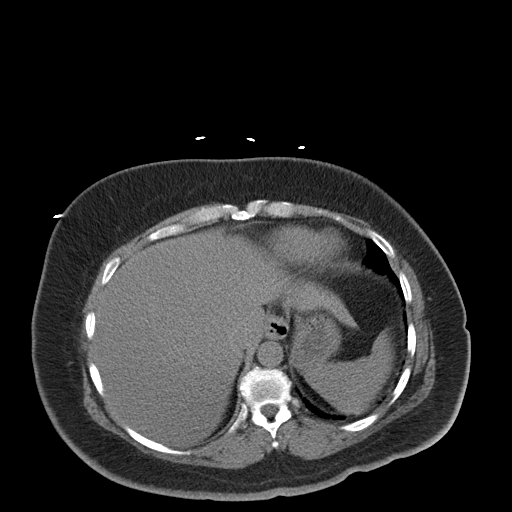
[im 85/98  soft-tissue]
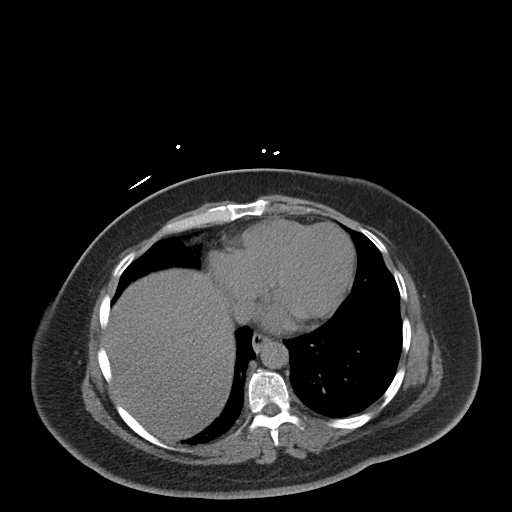
[im 91/98  soft-tissue]
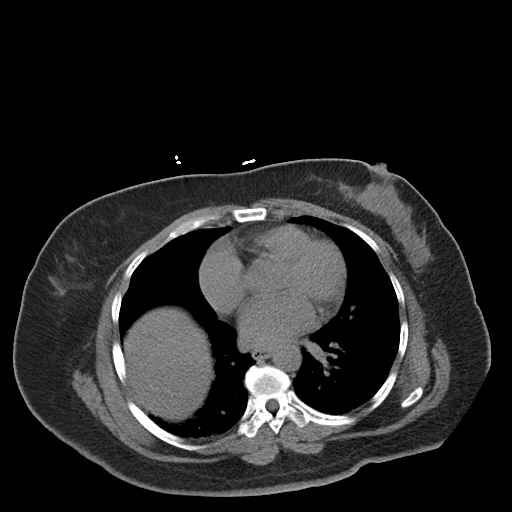

[Series 5: coronal · coronal · 0.95mm/px · 3 of 161 slices shown]
[im 54/161  soft-tissue]
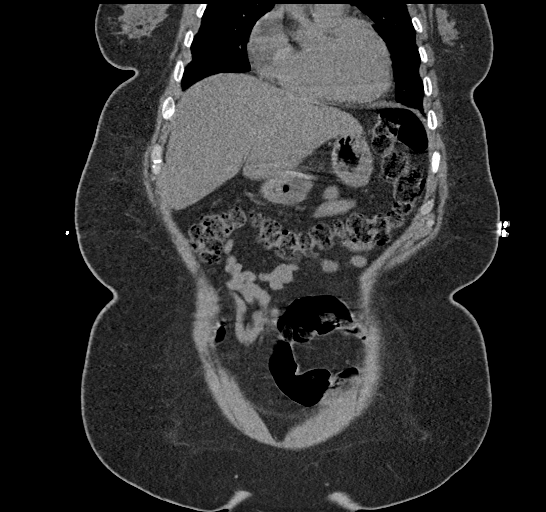
[im 72/161  soft-tissue]
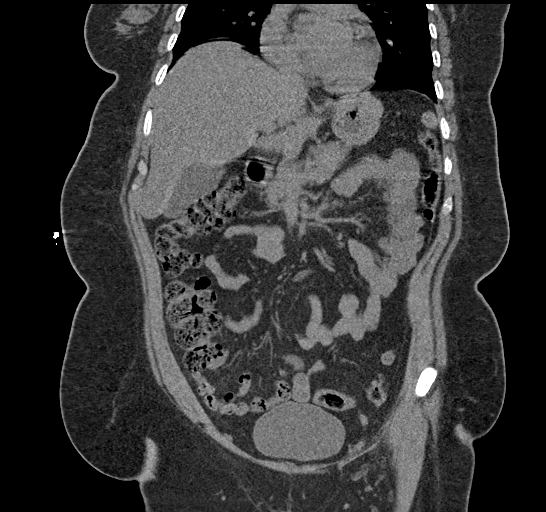
[im 89/161  soft-tissue]
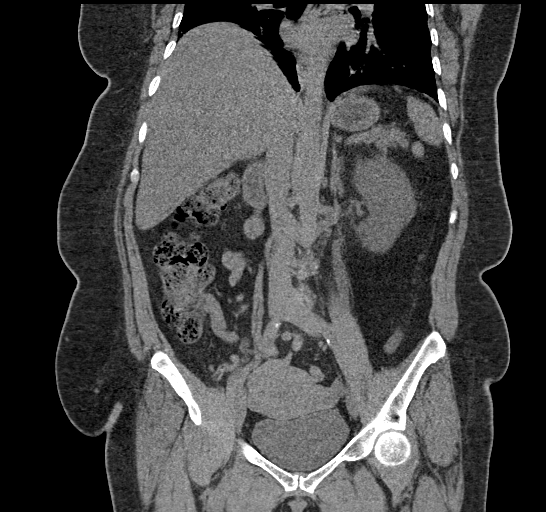

[17 of 46 positions shown; findings below may reference images not displayed]

FINDINGS: Lower chest: No acute abnormality.

Hepatobiliary: No gallstones or biliary dilatation is noted. Hepatic
steatosis is noted.

Pancreas: Unremarkable. No pancreatic ductal dilatation or
surrounding inflammatory changes.

Spleen: Normal in size without focal abnormality.

Adrenals/Urinary Tract: Adrenal glands appear normal. Mild left
hydroureteronephrosis is noted secondary to 4 mm calculus at the
left ureterovesical junction. Right kidney and ureter are
unremarkable. Urinary bladder is unremarkable.

Stomach/Bowel: Stomach is within normal limits. Appendix appears
normal. No evidence of bowel wall thickening, distention, or
inflammatory changes.

Vascular/Lymphatic: No significant vascular findings are present. No
enlarged abdominal or pelvic lymph nodes.

Reproductive: Uterus and bilateral adnexa are unremarkable.

Other: Small fat containing periumbilical hernia is noted. No
ascites is noted.

Musculoskeletal: No acute or significant osseous findings.
IMPRESSION: 1. Mild left hydroureteronephrosis is noted secondary to 4 mm
calculus at the left ureterovesical junction.
2. Hepatic steatosis.
3. Small fat containing periumbilical hernia.

## 2023-04-24 ENCOUNTER — Encounter (HOSPITAL_COMMUNITY): Payer: Self-pay

## 2023-04-24 ENCOUNTER — Emergency Department (HOSPITAL_COMMUNITY): Payer: Self-pay

## 2023-04-24 ENCOUNTER — Other Ambulatory Visit: Payer: Self-pay

## 2023-04-24 ENCOUNTER — Emergency Department (HOSPITAL_COMMUNITY)
Admission: EM | Admit: 2023-04-24 | Discharge: 2023-04-24 | Disposition: A | Discharge status: Home / Self Care | Payer: Self-pay | Attending: Emergency Medicine | Admitting: Emergency Medicine

## 2023-04-24 DIAGNOSIS — N939 Abnormal uterine and vaginal bleeding, unspecified: Secondary | ICD-10-CM | POA: Insufficient documentation

## 2023-04-24 LAB — BASIC METABOLIC PANEL
Anion gap: 9 (ref 5–15)
BUN: 12 mg/dL (ref 6–20)
CO2: 22 mmol/L (ref 22–32)
Calcium: 11.6 mg/dL — ABNORMAL HIGH (ref 8.9–10.3)
Chloride: 106 mmol/L (ref 98–111)
Creatinine, Ser: 0.6 mg/dL (ref 0.44–1.00)
GFR, Estimated: >60 mL/min (ref >60)
Glucose, Bld: 124 mg/dL — ABNORMAL HIGH (ref 70–99)
Potassium: 3.9 mmol/L (ref 3.5–5.1)
Sodium: 137 mmol/L (ref 135–145)

## 2023-04-24 LAB — CBC WITH DIFFERENTIAL/PLATELET
Abs Immature Granulocytes: 0.04 10*3/uL (ref 0.00–0.07)
Basophils Absolute: 0 10*3/uL (ref 0.0–0.1)
Basophils Relative: 1 %
Eosinophils Absolute: 0.2 10*3/uL (ref 0.0–0.5)
Eosinophils Relative: 3 %
HCT: 34.3 % — ABNORMAL LOW (ref 36.0–46.0)
Hemoglobin: 11.8 g/dL — ABNORMAL LOW (ref 12.0–15.0)
Immature Granulocytes: 1 %
Lymphocytes Relative: 33 %
Lymphs Abs: 2 10*3/uL (ref 0.7–4.0)
MCH: 31.5 pg (ref 26.0–34.0)
MCHC: 34.4 g/dL (ref 30.0–36.0)
MCV: 91.5 fL (ref 80.0–100.0)
Monocytes Absolute: 0.4 10*3/uL (ref 0.1–1.0)
Monocytes Relative: 7 %
Neutro Abs: 3.4 10*3/uL (ref 1.7–7.7)
Neutrophils Relative %: 55 %
Platelets: 215 10*3/uL (ref 150–400)
RBC: 3.75 MIL/uL — ABNORMAL LOW (ref 3.87–5.11)
RDW: 13.5 % (ref 11.5–15.5)
WBC: 6 10*3/uL (ref 4.0–10.5)
nRBC: 0 % (ref 0.0–0.2)

## 2023-04-24 LAB — HCG, SERUM, QUALITATIVE: Preg, Serum: NEGATIVE

## 2023-04-24 LAB — TYPE AND SCREEN
ABO/RH(D): O POS
Antibody Screen: NEGATIVE

## 2023-04-24 NOTE — ED Triage Notes (Signed)
 Reports started period after several months of no period.  Patient report around 2am started having heavy bleeding and clots.  Daughter reports blanket on bed was covered and patient going through 4-5 pads and hour.  Reports headache dizziness and weak.

## 2023-04-24 NOTE — ED Notes (Signed)
 This RN reviewed discharge instructions with patient. She verbalized understanding and denied any further questions. PT well appearing upon discharge and reports nopain. Pt ambulated with stable gait to exit. Pt endorses ride home with daughter and spouse.

## 2023-04-24 NOTE — ED Provider Notes (Signed)
 [place] EMERGENCY DEPARTMENT AT [facility] Provider Note  CSN: [id number] Arrival date & time: 04/24/23 0457  Chief Complaint(s) Vaginal Bleeding  HPI Yvette Stewart is a 48 y.o. female here today for vaginal bleeding.  Patient says that her last period was in August, but earliest morning she began to have vaginal bleeding.  She said that when it happened she began to feel faint and lightheaded.  No previous discharge, no pain.   Past Medical History History reviewed. No pertinent past medical history. Patient Active Problem List   Diagnosis Date Noted   Insomnia 12/10/2017   Abdominal pain 10/06/2017   Gastroesophageal reflux disease with esophagitis 10/06/2017   Home Medication(s) Prior to Admission medications   Medication Sig Start Date End Date Taking? Authorizing Provider  hydrOXYzine  (VISTARIL ) 25 MG capsule Take 1 capsule (25 mg total) by mouth at bedtime as needed. Patient not taking: Reported on 02/23/2020 12/10/17   Sagardia, Miguel Jose, MD  naproxen  (NAPROSYN ) 375 MG tablet Take 1 tablet (375 mg total) by mouth 2 (two) times daily. 03/19/20   Charlyn Sora, MD  omeprazole  (PRILOSEC) 20 MG capsule Take 1 capsule (20 mg total) by mouth daily. Patient not taking: Reported on 02/23/2020 10/02/17   Armenta Canning, MD  ondansetron  (ZOFRAN  ODT) 8 MG disintegrating tablet Take 1 tablet (8 mg total) by mouth every 8 (eight) hours as needed for nausea. 03/19/20   Charlyn Sora, MD  pantoprazole  (PROTONIX ) 20 MG tablet Take 1 tablet (20 mg total) by mouth daily. Patient not taking: Reported on 03/19/2020 11/04/14   Keith Sor, PA-C  ranitidine  (ZANTAC ) 300 MG tablet Take 1 tablet (300 mg total) by mouth at bedtime for 14 days. Patient not taking: Reported on 03/19/2020 10/06/17 10/20/17  Purcell Emil Schanz, MD  sucralfate  (CARAFATE ) 1 GM/10ML suspension Take 10 mLs (1 g total) by mouth 4 (four) times daily -  with meals and at bedtime. Patient not  taking: Reported on 03/19/2020 11/04/14   Keith Sor, PA-C  sucralfate  (CARAFATE ) 1 GM/10ML suspension Take 10 mLs (1 g total) by mouth 4 (four) times daily -  with meals and at bedtime. Patient not taking: Reported on 03/19/2020 10/02/17   Armenta Canning, MD  tamsulosin  (FLOMAX ) 0.4 MG CAPS capsule Take 1 capsule (0.4 mg total) by mouth daily. 03/19/20   Charlyn Sora, MD                                                                                                                                    Past Surgical History History reviewed. No pertinent surgical history. Family History History reviewed. No pertinent family history.  Social History Social History   Tobacco Use   Smoking status: Never   Smokeless tobacco: Never  Vaping Use   Vaping status: Never Used  Substance Use Topics   Alcohol use: No   Drug use: No   Allergies Tylenol [acetaminophen]  Review of  Systems Review of Systems  Physical Exam Vital Signs  I have reviewed the triage vital signs BP 132/73 (BP Location: Right Arm)   Pulse 62   Temp 98.3 F (36.8 C) (Oral)   Resp 17   Ht 5' 1.5 (1.562 m)   Wt 74.4 kg   SpO2 100%   BMI 30.49 kg/m   Physical Exam Vitals reviewed.  Cardiovascular:     Rate and Rhythm: Normal rate.  Genitourinary:    Comments: Nurse chaperone present.  Normal external genitalia.  No rashes, no lesions.  On speculum exam, scant amount of blood in the vaginal vault.  Cervical os visualized, no friable tissue.  No brisk bleeding. Neurological:     Mental Status: She is alert.     ED Results and Treatments Labs (all labs ordered are listed, but only abnormal results are displayed) Labs Reviewed  CBC WITH DIFFERENTIAL/PLATELET - Abnormal; Notable for the following components:      Result Value   RBC 3.75 (*)    Hemoglobin 11.8 (*)    HCT 34.3 (*)    All other components within normal limits  BASIC METABOLIC PANEL - Abnormal; Notable for the following components:    Glucose, Bld 124 (*)    Calcium 11.6 (*)    All other components within normal limits  HCG, SERUM, QUALITATIVE  TYPE AND SCREEN  ABO/RH                                                                                                                          Radiology No results found.  Pertinent labs & imaging results that were available during my care of the patient were reviewed by me and considered in my medical decision making (see MDM for details).  Medications Ordered in ED Medications - No data to display                                                                                                                                   Procedures Procedures  (including critical care time)  Medical Decision Making / ED Course   This patient presents to the ED for concern of vaginal bleeding, this involves an extensive number of treatment options, and is a complaint that carries with it a high risk of complications and morbidity.  The differential diagnosis includes menopause, irregular periods, endometrial CA, less likely pregnancy.  MDM: Reassuring speculum  exam.  No brisk bleeding.  With the patient's reported history of note.  Since August and a negative pregnancy test, believe that this is likely irregular periods as patient approaches menopause.  Discussed this with the patient via Spanish interpreter.  Will obtain a transvaginal ultrasound on the patient.  Hemoglobin a little bit lower at 11.8.  Reassessment 2:20 PM-patient's ultrasound normal.  Discussed this with the patient and the family at bedside.  Will discharge home.   Additional history obtained: -Additional history obtained from daughter at bedside -External records from outside source obtained and reviewed including: Chart review including previous notes, labs, imaging, consultation notes   Lab Tests: -I ordered, reviewed, and interpreted labs.   The pertinent results include:   Labs Reviewed  CBC WITH  DIFFERENTIAL/PLATELET - Abnormal; Notable for the following components:      Result Value   RBC 3.75 (*)    Hemoglobin 11.8 (*)    HCT 34.3 (*)    All other components within normal limits  BASIC METABOLIC PANEL - Abnormal; Notable for the following components:   Glucose, Bld 124 (*)    Calcium 11.6 (*)    All other components within normal limits  HCG, SERUM, QUALITATIVE  TYPE AND SCREEN  ABO/RH      EKG   EKG Interpretation Date/Time:    Ventricular Rate:    PR Interval:    QRS Duration:    QT Interval:    QTC Calculation:   R Axis:      Text Interpretation:           Imaging Studies ordered: I ordered imaging studies including transvaginal ultrasound I independently visualized and interpreted imaging. I agree with the radiologist interpretation   Medicines ordered and prescription drug management: No orders of the defined types were placed in this encounter.   -I have reviewed the patients home medicines and have made adjustments as needed  Cardiac Monitoring: The patient was maintained on a cardiac monitor.  I personally viewed and interpreted the cardiac monitored which showed an underlying rhythm of:   Social Determinants of Health:  Factors impacting patients care include: Lack of access to primary care   Reevaluation: After the interventions noted above, I reevaluated the patient and found that they have :improved  Co morbidities that complicate the patient evaluation History reviewed. No pertinent past medical history.    Dispostion: I considered admission for this patient, however without significant active bleeding, and hemodynamically stable, do not believe it is indicated.     Final Clinical Impression(s) / ED Diagnoses Final diagnoses:  None     @PCDICTATION @    Mannie Pac T, DO 04/24/23 1428

## 2023-04-24 NOTE — Discharge Instructions (Signed)
 While you are in the emergency department, you had blood work done that showed that your hemoglobin, or red blood cells, was just a little bit low.  Follow-up with your regular doctor to have this checked in 1 to 2 weeks.  Your ultrasound was normal.  Like we discussed, I think that your bleeding is being caused by the beginning of menopause.  What can often happen is that when periods become more regular, the lining of your uterus can grow a little bit larger than it would otherwise.  Then when you have your usual menstrual cycle, it is often a lot heavier than previously.  Include your discharge paperwork is also a telephone number for an OB/GYN group.  You can call them to make an appointment.  Return to the emergency department if develop fever, feel lightheaded or lose consciousness.
